# Patient Record
Sex: Female | Born: 1970
Health system: Southern US, Community
[De-identification: ages and names within clinical notes are randomized; demographics above are authoritative.]

## PROBLEM LIST (undated history)

## (undated) DIAGNOSIS — D219 Benign neoplasm of connective and other soft tissue, unspecified: Secondary | ICD-10-CM

## (undated) DIAGNOSIS — R030 Elevated blood-pressure reading, without diagnosis of hypertension: Secondary | ICD-10-CM

## (undated) DIAGNOSIS — Z8619 Personal history of other infectious and parasitic diseases: Secondary | ICD-10-CM

## (undated) DIAGNOSIS — Z87898 Personal history of other specified conditions: Secondary | ICD-10-CM

## (undated) DIAGNOSIS — J45909 Unspecified asthma, uncomplicated: Secondary | ICD-10-CM

## (undated) DIAGNOSIS — T7840XA Allergy, unspecified, initial encounter: Secondary | ICD-10-CM

## (undated) HISTORY — DX: Benign neoplasm of connective and other soft tissue, unspecified: D21.9

## (undated) HISTORY — PX: HERNIA REPAIR: SHX51

## (undated) HISTORY — DX: Elevated blood-pressure reading, without diagnosis of hypertension: R03.0

## (undated) HISTORY — DX: Allergy, unspecified, initial encounter: T78.40XA

## (undated) HISTORY — DX: Personal history of other specified conditions: Z87.898

## (undated) HISTORY — DX: Unspecified asthma, uncomplicated: J45.909

## (undated) HISTORY — DX: Personal history of other infectious and parasitic diseases: Z86.19

## (undated) HISTORY — PX: TONSILLECTOMY AND ADENOIDECTOMY: SUR1326

---

## 2005-03-31 ENCOUNTER — Encounter (INDEPENDENT_AMBULATORY_CARE_PROVIDER_SITE_OTHER): Payer: Self-pay | Admitting: Internal Medicine

## 2005-05-03 ENCOUNTER — Ambulatory Visit: Payer: Self-pay | Admitting: Family Medicine

## 2005-05-08 ENCOUNTER — Ambulatory Visit: Payer: Self-pay | Admitting: Family Medicine

## 2006-01-29 ENCOUNTER — Ambulatory Visit: Payer: Self-pay | Admitting: Family Medicine

## 2007-02-03 ENCOUNTER — Ambulatory Visit: Payer: Self-pay | Admitting: Family Medicine

## 2009-02-10 ENCOUNTER — Ambulatory Visit: Payer: Self-pay | Admitting: Family Medicine

## 2009-02-10 DIAGNOSIS — M542 Cervicalgia: Secondary | ICD-10-CM | POA: Insufficient documentation

## 2009-02-23 ENCOUNTER — Encounter (INDEPENDENT_AMBULATORY_CARE_PROVIDER_SITE_OTHER): Payer: Self-pay | Admitting: Internal Medicine

## 2009-03-04 ENCOUNTER — Encounter (INDEPENDENT_AMBULATORY_CARE_PROVIDER_SITE_OTHER): Payer: Self-pay | Admitting: Internal Medicine

## 2009-03-04 DIAGNOSIS — I10 Essential (primary) hypertension: Secondary | ICD-10-CM | POA: Insufficient documentation

## 2009-03-04 DIAGNOSIS — J45909 Unspecified asthma, uncomplicated: Secondary | ICD-10-CM | POA: Insufficient documentation

## 2009-03-04 DIAGNOSIS — R030 Elevated blood-pressure reading, without diagnosis of hypertension: Secondary | ICD-10-CM

## 2017-01-28 LAB — HM PAP SMEAR: HM Pap smear: NEGATIVE

## 2017-06-12 LAB — HM MAMMOGRAPHY

## 2017-12-11 ENCOUNTER — Encounter: Payer: Self-pay | Admitting: Nurse Practitioner

## 2017-12-11 ENCOUNTER — Ambulatory Visit (INDEPENDENT_AMBULATORY_CARE_PROVIDER_SITE_OTHER): Payer: BLUE CROSS/BLUE SHIELD | Admitting: Nurse Practitioner

## 2017-12-11 ENCOUNTER — Other Ambulatory Visit: Payer: Self-pay

## 2017-12-11 VITALS — BP 155/80 | HR 83 | Temp 97.8°F | Ht 63.5 in | Wt 146.6 lb

## 2017-12-11 DIAGNOSIS — B009 Herpesviral infection, unspecified: Secondary | ICD-10-CM

## 2017-12-11 DIAGNOSIS — B002 Herpesviral gingivostomatitis and pharyngotonsillitis: Secondary | ICD-10-CM

## 2017-12-11 DIAGNOSIS — Z7689 Persons encountering health services in other specified circumstances: Secondary | ICD-10-CM

## 2017-12-11 DIAGNOSIS — D219 Benign neoplasm of connective and other soft tissue, unspecified: Secondary | ICD-10-CM | POA: Diagnosis not present

## 2017-12-11 DIAGNOSIS — Z0000 Encounter for general adult medical examination without abnormal findings: Secondary | ICD-10-CM

## 2017-12-11 DIAGNOSIS — Z789 Other specified health status: Secondary | ICD-10-CM

## 2017-12-11 DIAGNOSIS — R030 Elevated blood-pressure reading, without diagnosis of hypertension: Secondary | ICD-10-CM

## 2017-12-11 MED ORDER — VALACYCLOVIR HCL 1 G PO TABS: 2000.0000 mg | ORAL_TABLET | Freq: Two times a day (BID) | ORAL | 0 refills | Status: AC

## 2017-12-11 NOTE — Patient Instructions (Addendum)
Yilia, Thank you for coming in to clinic today.  1. Dentistry - [facility] Address: [address] [place], [place] [address]  Phone: [phone]  - Ruthy Dick, DDS Address: [address], [place], [place] [address]  Phone: [phone]  2. Optometry/Opthalmology [facility]         Address: [address] [place], [place] [address]    Phone: [phone]       Website: [email or web address] Address: [address], [place], [place] [address]  Phone: [phone]  Website: [email or web address]   [facility]         Address: [place], [place], [place] [address]    Phone: [phone]         [facility]  Address: [address] [place], [place], [place] [address] Phone: [phone]   [facility] Address: [place], [place],  [address]  Phone: [phone]  3. Shingles vaccine: CDC website- Research Shingrix vaccine.  4. Blood Pressure: Goal less than 130/80.  Call for an appointment sooner if you need to come in for blood pressure medicine. - Increase exercise. - Watch diet with lower salt, more vegetables and fruits.    5. You will be due for FASTING BLOOD WORK.  This means you should eat no food or drink after midnight.  Drink only water or coffee without cream/sugar on the morning of your lab visit. - Please go ahead and schedule a "Lab Only" visit in the morning at the clinic for lab draw in the next 7 days. - Your results will be available about 2-3 days after blood draw.  If you have set up a MyChart account, you can can log in to MyChart online to view your results and a brief explanation. Also, we can discuss your results together at your next office visit if you would like.  Please schedule a follow-up appointment with Cassell Smiles, AGNP. Return in about 1 year (around 12/11/2018) for annual physical.  If you have any other questions or concerns, please feel free to call the clinic  or send a message through [place]. You may also schedule an earlier appointment if necessary.  You will receive a survey after today's visit either digitally by e-mail or paper by [employer]. Your experiences and feedback matter to [place].  Please respond so we know how we are doing as we provide care for you.   Cassell Smiles, DNP, AGNP-BC Adult Gerontology Nurse Practitioner [place]

## 2017-12-11 NOTE — Progress Notes (Signed)
Subjective:    Patient ID: Ann Morrow, female    DOB: [birth year], 46 y.o.   MRN: [id number]  Ann Morrow is a 46 y.o. female presenting on 12/11/2017 for Establish Care (physical appt, Recently moved back in the state. )   HPI [facility] Provider Pt last seen by PCP in [place], [place].  Last physical 2017 Dr. Vertell Limber [phone].  Last PAP and Mammograms were ordered by GYN Dr. Alvy Bimler [phone].   Last GYN visit in January 2018.  Uterine Fibroids Has problems w/ multiple uterine fibroids.  Has opted out of surgery and takes OCP instead.  Must use brand name Aleese, Aviane, or similar.  - She has seen GYN and was recommended to have surgical removal of multiple fibroids.  Pt declines currently - Reports no dysmenorrhea when on OCP.   Elevated BP readings w/o hypertension Pt w/ consistently high readings in doctor's offices.  [facility] 130[facility]75.  Family history of hypertension noted.  Pt reports decreased BP when she is walking and exercising more. Is also working to lose 10 lbs of weight.  Oral herpes Pt requests refill of acyclovir for oral herpes outbreaks.  She has completed her last dose about 3 weeks ago and does not have any additional for future outbreaks.  Pt has not previously used valacyclovir, but is willing to try.  Other General History Pt has had trouble w/ vertigo, but no recent symptoms. Pt has also already had shingles outbreak.  No vaccine. Pt is Jehovah's Witness and would like designation placed on chart for refusal of blood products.  Annual Physical Exam Patient has been feeling well.  They have no acute concerns today. Sleeps 7 hours per night interrupted once, but returns to sleep easily.  HEALTH MAINTENANCE: Weight/BMI: healthy Physical activity: regular 2-3 times per week Diet: Has started Keto diet.  Seatbelt: always Sunscreen: regularly w/ prolonged exposure PAP: normal last in  January 2018 Mammogram: normal in 2018 Colon Cancer Screen: wait to 50 HIV: declines Optometry: regular visits Dentistry: regular visits  VACCINES: Tetanus: likely less than 10 years ago, definitely more than 5 years ago Influenza: declines Shingles: declines today. Wants to read more about it.  Past Medical History:  Diagnosis Date  . Allergy   . Asthma   . Elevated BP without diagnosis of hypertension   . Fibroid   . History of shingles   . History of vertigo    Past Surgical History:  Procedure Laterality Date  . HERNIA REPAIR    . TONSILLECTOMY AND ADENOIDECTOMY     Social History   Socioeconomic History  . Marital status: Single    Spouse name: Not on file  . Number of children: Not on file  . Years of education: Not on file  . Highest education level: Not on file  Social Needs  . Financial resource strain: Not hard at all  . Food insecurity - worry: Never true  . Food insecurity - inability: Never true  . Transportation needs - medical: No  . Transportation needs - non-medical: No  Occupational History  . Occupation: Banking  Tobacco Use  . Smoking status: Never Smoker  . Smokeless tobacco: Current User  Substance and Sexual Activity  . Alcohol use: Yes    Alcohol/week: 0.6 oz    Types: 1 Glasses of wine per week  . Drug use: No  . Sexual activity: Yes    Birth control/protection: Pill  Other Topics Concern  . 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  In mutually monogamous relationship and married for 17 years (as of 12/11/17).   Family History  Problem Relation Age of Onset  . Hypertension Mother   . Diabetes Father   . Breast cancer Maternal Grandmother   . Renal cancer Maternal Grandfather   . Liver cancer Paternal Grandmother   . Cancer Paternal Grandfather   . Healthy Brother   . Healthy Brother    Current Outpatient Medications on File Prior to Visit  Medication  Sig  . AVIANE 0.1-20 MG-MCG tablet   . acyclovir (ZOVIRAX) 200 MG capsule Take 200 mg by mouth 5 (five) times daily.   No current facility-administered medications on file prior to visit.     Review of Systems  Constitutional: Negative.   HENT: Negative.   Eyes: Negative.   Respiratory: Negative.   Cardiovascular: Negative.   Gastrointestinal: Positive for constipation.  Endocrine: Negative.   Genitourinary: Positive for pelvic pain (constipation, increased bloating (2/2 uterine fibroids)). Negative for menstrual problem and vaginal pain.  Musculoskeletal: Negative.   Skin: Negative.   Allergic/Immunologic: Negative.   Neurological: Negative.   Hematological: Negative.   Psychiatric/Behavioral: Negative.    Per HPI unless specifically indicated above.     Objective:    BP (!) 155/80 (BP Location: Right Arm, Patient Position: Sitting, Cuff Size: Normal)   Pulse 83   Temp 97.8 F (36.6 C) (Oral)   Ht 5' 3.5" (1.613 m)   Wt 146 lb 9.6 oz (66.5 kg)   BMI 25.56 kg/m   Wt Readings from Last 3 Encounters:  12/11/17 146 lb 9.6 oz (66.5 kg)    Physical Exam General - healthy, well-appearing, NAD HEENT - Normocephalic, atraumatic, PERRL, EOMI, patent nares w/o congestion, oropharynx clear, MMM Neck - supple, non-tender, no LAD, no thyromegaly, no carotid bruit Heart - RRR, no murmurs heard, normal S1/S2  Lungs - Clear throughout all lobes, no wheezing, crackles, or rhonchi. Normal work of breathing. Abdomen - soft, NTND, no masses, no hepatosplenomegaly, active bowel sounds GU and Breast exam deferred by pt today.   Extremeties - non-tender, no edema, cap refill < 2 seconds, peripheral pulses intact +2 bilaterally Skin - warm, dry, no rashes Neuro - awake, alert, oriented x3, CN II-X intact, intact muscle strength 5/5 bilaterally, intact distal sensation to light touch, normal coordination, normal gait Psych - Normal mood and affect, normal behavior   Results for orders  placed or performed in visit on 03/31/05  Converted [facility] Lab  Result Value Ref Range   Pap Smear Done/GYN       Assessment & Plan:   Problem List Items Addressed This Visit      Digestive   Oral herpes simplex, not currently active    Pt requests fill of acyclovir today. Has not used valacyclovir, but is willing to try since has fewer doses required.  Plan: 1. START valacyclovir 2 tablets twice daily for one day at start of oral herpes outbreak. 2. Followup as needed.      Relevant Medications   acyclovir (ZOVIRAX) 200 MG capsule     Other   ELEVATED BP READING WITHOUT DX HYPERTENSION    Pt reports controlled BP readings outside of clinic.  Continue to monitor for elevated BP readings.    Plan: 1. Discussed need for early management of HTN to prevent long-term damage to kidneys, heart, eyes,  blood vessels. 2. Discussed BP goal <130/80 3. Followup at next visit.      Refusal of blood product    Pt is Jehovah's Witness and states, "No whole blood product transfusion." Pt is ok with receiving blood volume expanders.      Fibroid    Pt reports multiple uterine fibroids.  Prefers to continue medical management w/ OCP.  Plan: 1. Encouraged pt to re-establish w/ GYN for ongoing care and future surgical management discussions.  Discussed pt will need to stop taking OCP to allow for completion of menopause in next 5-7 years. 2. Followup as needed.      Relevant Medications   AVIANE 0.1-20 MG-MCG tablet    Other Visit Diagnoses    Annual physical exam    -  Primary Physical exam with no new findings.  Well adult with no acute concerns.  Plan: 1. Obtain health maintenance screenings. 2. Return 1 year for annual physical.   Relevant Orders   Lipid panel   Comprehensive metabolic panel   Hemoglobin A1c   CBC with Differential/Platelet   TSH   Encounter to establish care     Previous PCP was in [place].  Records will be requested.  Past medical, family, and surgical  history reviewed w/ pt.      Meds ordered this encounter  Medications  . valACYclovir (VALTREX) 1000 MG tablet    Sig: Take 2 tablets (2,000 mg total) by mouth 2 (two) times daily for 1 day.    Dispense:  8 tablet    Refill:  0    Order Specific Question:   Supervising Provider    Answer:   Olin Hauser [[id number]]    Follow up plan: Return in about 1 year (around 12/11/2018) for annual physical.  Cassell Smiles, DNP, AGPCNP-BC Adult Gerontology Primary Care Nurse Practitioner [facility] 12/17/2017, 1:22 PM

## 2017-12-17 ENCOUNTER — Encounter: Payer: Self-pay | Admitting: Nurse Practitioner

## 2017-12-17 DIAGNOSIS — B009 Herpesviral infection, unspecified: Secondary | ICD-10-CM | POA: Insufficient documentation

## 2017-12-17 DIAGNOSIS — D219 Benign neoplasm of connective and other soft tissue, unspecified: Secondary | ICD-10-CM | POA: Insufficient documentation

## 2017-12-17 DIAGNOSIS — B002 Herpesviral gingivostomatitis and pharyngotonsillitis: Secondary | ICD-10-CM | POA: Insufficient documentation

## 2017-12-17 NOTE — Assessment & Plan Note (Signed)
Pt reports controlled BP readings outside of clinic.  Continue to monitor for elevated BP readings.    Plan: 1. Discussed need for early management of HTN to prevent long-term damage to kidneys, heart, eyes, blood vessels. 2. Discussed BP goal <130/80 3. Followup at next visit.

## 2017-12-17 NOTE — Assessment & Plan Note (Signed)
Pt reports multiple uterine fibroids.  Prefers to continue medical management w/ OCP.  Plan: 1. Encouraged pt to re-establish w/ GYN for ongoing care and future surgical management discussions.  Discussed pt will need to stop taking OCP to allow for completion of menopause in next 5-7 years. 2. Followup as needed.

## 2017-12-17 NOTE — Assessment & Plan Note (Signed)
Pt is Jehovah's Witness and states, "No whole blood product transfusion." Pt is ok with receiving blood volume expanders.

## 2017-12-17 NOTE — Assessment & Plan Note (Signed)
Pt requests fill of acyclovir today. Has not used valacyclovir, but is willing to try since has fewer doses required.  Plan: 1. START valacyclovir 2 tablets twice daily for one day at start of oral herpes outbreak. 2. Followup as needed.

## 2017-12-18 ENCOUNTER — Other Ambulatory Visit: Payer: BLUE CROSS/BLUE SHIELD

## 2017-12-18 DIAGNOSIS — Z0000 Encounter for general adult medical examination without abnormal findings: Secondary | ICD-10-CM | POA: Diagnosis not present

## 2017-12-19 LAB — CBC WITH DIFFERENTIAL/PLATELET
Basophils Absolute: 48 cells/uL (ref 0–200)
Basophils Relative: 1.5 %
Eosinophils Absolute: 128 cells/uL (ref 15–500)
Eosinophils Relative: 4 %
HCT: 41.5 % (ref 35.0–45.0)
Hemoglobin: 13.7 g/dL (ref 11.7–15.5)
Lymphs Abs: 1264 cells/uL (ref 850–3900)
MCH: 28 pg (ref 27.0–33.0)
MCHC: 33 g/dL (ref 32.0–36.0)
MCV: 84.7 fL (ref 80.0–100.0)
MPV: 11 fL (ref 7.5–12.5)
Monocytes Relative: 10.2 %
Neutro Abs: 1434 cells/uL — ABNORMAL LOW (ref 1500–7800)
Neutrophils Relative %: 44.8 %
Platelets: 326 10*3/uL (ref 140–400)
RBC: 4.9 10*6/uL (ref 3.80–5.10)
RDW: 12.6 % (ref 11.0–15.0)
Total Lymphocyte: 39.5 %
WBC mixed population: 326 cells/uL (ref 200–950)
WBC: 3.2 10*3/uL — ABNORMAL LOW (ref 3.8–10.8)

## 2017-12-19 LAB — COMPREHENSIVE METABOLIC PANEL
AG Ratio: 1.8 (calc) (ref 1.0–2.5)
ALT: 9 U/L (ref 6–29)
AST: 11 U/L (ref 10–35)
Albumin: 4.1 g/dL (ref 3.6–5.1)
Alkaline phosphatase (APISO): 31 U/L — ABNORMAL LOW (ref 33–115)
BUN: 13 mg/dL (ref 7–25)
CO2: 27 mmol/L (ref 20–32)
Calcium: 9.6 mg/dL (ref 8.6–10.2)
Chloride: 103 mmol/L (ref 98–110)
Creat: 1.09 mg/dL (ref 0.50–1.10)
Globulin: 2.3 g/dL (calc) (ref 1.9–3.7)
Glucose, Bld: 99 mg/dL (ref 65–99)
Potassium: 4.2 mmol/L (ref 3.5–5.3)
Sodium: 137 mmol/L (ref 135–146)
Total Bilirubin: 0.5 mg/dL (ref 0.2–1.2)
Total Protein: 6.4 g/dL (ref 6.1–8.1)

## 2017-12-19 LAB — HEMOGLOBIN A1C
Hgb A1c MFr Bld: 5.8 % of total Hgb — ABNORMAL HIGH (ref <5.7)
Mean Plasma Glucose: 120 (calc)
eAG (mmol/L): 6.6 (calc)

## 2017-12-19 LAB — LIPID PANEL
Cholesterol: 170 mg/dL (ref <200)
HDL: 56 mg/dL (ref >50)
LDL Cholesterol (Calc): 96 mg/dL (calc)
Non-HDL Cholesterol (Calc): 114 mg/dL (calc) (ref <130)
Total CHOL/HDL Ratio: 3 (calc) (ref <5.0)
Triglycerides: 86 mg/dL (ref <150)

## 2017-12-19 LAB — TSH: TSH: 0.68 mIU/L

## 2018-01-06 ENCOUNTER — Encounter: Payer: Self-pay | Admitting: Nurse Practitioner

## 2018-01-24 DIAGNOSIS — Z86018 Personal history of other benign neoplasm: Secondary | ICD-10-CM | POA: Diagnosis not present

## 2018-01-24 DIAGNOSIS — D259 Leiomyoma of uterus, unspecified: Secondary | ICD-10-CM | POA: Diagnosis not present

## 2018-01-24 DIAGNOSIS — I10 Essential (primary) hypertension: Secondary | ICD-10-CM | POA: Diagnosis not present

## 2018-01-24 DIAGNOSIS — Z01411 Encounter for gynecological examination (general) (routine) with abnormal findings: Secondary | ICD-10-CM | POA: Diagnosis not present

## 2018-05-01 ENCOUNTER — Encounter: Payer: Self-pay | Admitting: Nurse Practitioner

## 2018-05-01 ENCOUNTER — Other Ambulatory Visit: Payer: Self-pay

## 2018-05-01 ENCOUNTER — Ambulatory Visit (INDEPENDENT_AMBULATORY_CARE_PROVIDER_SITE_OTHER): Payer: BLUE CROSS/BLUE SHIELD | Admitting: Nurse Practitioner

## 2018-05-01 VITALS — BP 132/76 | HR 71 | Temp 98.3°F | Ht 63.5 in | Wt 141.2 lb

## 2018-05-01 DIAGNOSIS — R7303 Prediabetes: Secondary | ICD-10-CM

## 2018-05-01 DIAGNOSIS — K1379 Other lesions of oral mucosa: Secondary | ICD-10-CM

## 2018-05-01 DIAGNOSIS — R198 Other specified symptoms and signs involving the digestive system and abdomen: Secondary | ICD-10-CM

## 2018-05-01 NOTE — Patient Instructions (Addendum)
Ann Morrow,   Thank you for coming in to clinic today.  This is most likely an allergic reaction.  1. Get back to regular toothpaste that you know you tolerate well.  2. Stop using baking soda as this may be worsening.  3. Take Zyrtec daily for about 7 days if you can.  Benadryl may also help if you need additional antihistamine.  Please schedule a follow-up appointment with Cassell Smiles, AGNP. Return 5-7 days if symptoms worsen or fail to improve.  Call your dentist for additional advice.  If you have any other questions or concerns, please feel free to call the clinic or send a message through [place]. You may also schedule an earlier appointment if necessary.  You will receive a survey after today's visit either digitally by e-mail or paper by [employer]. Your experiences and feedback matter to [place].  Please respond so we know how we are doing as we provide care for you.   Cassell Smiles, DNP, AGNP-BC Adult Gerontology Nurse Practitioner [place]

## 2018-05-01 NOTE — Progress Notes (Signed)
Subjective:    Patient ID: Ann Morrow, female    DOB: 1971/09/12, 47 y.o.   MRN: [id number]  Ann Morrow is a 47 y.o. female presenting on 05/01/2018 for Allergic Reaction (swelling in the mouth x 2 weeks )   HPI Mouth Swelling Lateral lips/buccal mucosa are swollen and tender x 2 weeks.  Today is improved.  No new products or foods to patient's knowledge.  However, after further thought pt admits she did try new toothpaste that caused irritation.  Is now using only baking soda for last 1.5 weeks.   - Has taken Zyrtec about every other day over last 2 weeks.There have been some sores in mouth with erythema closer to vermillion border of lips. - She has gargled with salt water, oregano oil. - She has also used a small amount of peppermint oil with her baking soda for brushing her teeth.  Prediabetes At annual physical screening, pt's A1c was 5.8%.  Since that visit, pt has reduced sugar intake and increased exercise.   - Current treatment : lifestyle - She is not currently symptomatic.  She also states she had no symptoms  - She denies polydipsia, polyphagia, polyuria, headaches, diaphoresis, shakiness, chills, pain, numbness or tingling in extremities and changes in vision.    Recent Labs    12/18/17 0803  HGBA1C 5.8*    Social History   Tobacco Use  . Smoking status: Never Smoker  . Smokeless tobacco: Current User  Substance Use Topics  . Alcohol use: Yes    Alcohol/week: 0.6 oz    Types: 1 Glasses of wine per week  . Drug use: No    Review of Systems Per HPI unless specifically indicated above     Objective:    BP 132/76 (BP Location: Right Arm, Patient Position: Sitting, Cuff Size: Normal)   Pulse 71   Temp 98.3 F (36.8 C) (Oral)   Ht 5' 3.5" (1.613 m)   Wt 141 lb 3.2 oz (64 kg)   LMP 04/01/2018 (Approximate)   BMI 24.62 kg/m   Wt Readings from Last 3 Encounters:  05/01/18 141 lb 3.2 oz (64 kg)  12/11/17 146 lb 9.6 oz (66.5 kg)    Physical Exam    Constitutional: She is oriented to person, place, and time. She appears well-developed and well-nourished. No distress.  HENT:  Head: Normocephalic and atraumatic.  Mouth/Throat: Uvula is midline and oropharynx is clear and moist.    Small line of erythematous pustules at vermillion border of oral mucosa.  Additionally, small lesion < 3 mm x 1 mm erythematous patch with raised borders is present.  Neck: Normal range of motion.  Pulmonary/Chest: Effort normal.  Neurological: She is alert and oriented to person, place, and time.  Skin: Skin is warm and dry. Capillary refill takes less than 2 seconds.  Psychiatric: She has a normal mood and affect. Her behavior is normal. Judgment and thought content normal.  Vitals reviewed.    Results for orders placed or performed in visit on 01/06/18  HM MAMMOGRAPHY  Result Value Ref Range   HM Mammogram 0-4 Bi-Rad 0-4 Bi-Rad, Self Reported Normal  HM PAP SMEAR  Result Value Ref Range   HM Pap smear negative       Assessment & Plan:   Problem List Items Addressed This Visit    None    Visit Diagnoses    Mucosal irritation of oral cavity    -  Primary Likely irritant or contact allergy 2/2  new toothpaste exacerbated by baking soda use.  Possible irritation also caused by peppermint oil.    Plan: 1. May continue salt water gargles. 2. Avoid use of baking soda as it may be irritating. 3. Return to previously trusted brand of toothpaste. 4. Take Zyrtec or Benadryl daily for at least 7 days. 5. Considered prednisone, but pt declines.  Severity of lesions also does not require treatment with prednisone at this time. 6. Followup with dentist for further evaluation of lesions if no improvement in next 5-7 days.    Prediabetes     Pt with new reading of A1c = 5.8% indicated prediabetes at annual physical in December 2019.  Pt has made lifestyle changes and would like to repeat her lab to verify the impact it is having.  Plan:  1. Continue  lifestyle modification 2. Encourage improved lifestyle: - low carb/low glycemic diet reinforced prior education - Increase physical activity to 30 minutes most days of the week.   6. Follow-up with annual physical screenings in December.    Relevant Orders   Hemoglobin A1c      Follow up plan: Return 5-7 days if symptoms worsen or fail to improve.  Cassell Smiles, DNP, AGPCNP-BC Adult Gerontology Primary Care Nurse Practitioner [facility] 05/01/2018, 9:03 AM

## 2018-05-02 LAB — HEMOGLOBIN A1C
Hgb A1c MFr Bld: 5.8 % of total Hgb — ABNORMAL HIGH (ref <5.7)
Mean Plasma Glucose: 120 (calc)
eAG (mmol/L): 6.6 (calc)

## 2018-08-15 ENCOUNTER — Other Ambulatory Visit: Payer: Self-pay | Admitting: Obstetrics and Gynecology

## 2018-08-15 DIAGNOSIS — Z1231 Encounter for screening mammogram for malignant neoplasm of breast: Secondary | ICD-10-CM

## 2018-09-03 ENCOUNTER — Ambulatory Visit
Admission: RE | Admit: 2018-09-03 | Discharge: 2018-09-03 | Disposition: A | Discharge status: Home / Self Care | Payer: BLUE CROSS/BLUE SHIELD | Source: Ambulatory Visit | Attending: Obstetrics and Gynecology | Admitting: Obstetrics and Gynecology

## 2018-09-03 DIAGNOSIS — Z1231 Encounter for screening mammogram for malignant neoplasm of breast: Secondary | ICD-10-CM | POA: Diagnosis not present

## 2018-09-10 ENCOUNTER — Other Ambulatory Visit: Payer: Self-pay | Admitting: *Deleted

## 2018-09-10 ENCOUNTER — Inpatient Hospital Stay
Admission: RE | Admit: 2018-09-10 | Discharge: 2018-09-10 | Disposition: A | Discharge status: Home / Self Care | Payer: Self-pay | Source: Ambulatory Visit | Attending: *Deleted | Admitting: *Deleted

## 2018-09-10 DIAGNOSIS — Z9289 Personal history of other medical treatment: Secondary | ICD-10-CM

## 2018-09-19 ENCOUNTER — Telehealth: Payer: Self-pay | Admitting: Nurse Practitioner

## 2018-09-19 ENCOUNTER — Other Ambulatory Visit: Payer: Self-pay

## 2018-09-19 DIAGNOSIS — D219 Benign neoplasm of connective and other soft tissue, unspecified: Secondary | ICD-10-CM

## 2018-09-19 MED ORDER — AVIANE 0.1-20 MG-MCG PO TABS: 1.0000 | ORAL_TABLET | Freq: Every day | ORAL | 3 refills | Status: DC

## 2018-09-19 NOTE — Telephone Encounter (Signed)
Pt asked if she could get refill on aviane.  [facility] [place]  Her call back number is [phone]

## 2018-09-19 NOTE — Telephone Encounter (Signed)
Prescription sent and I attempted to contact the patient to notify.

## 2018-12-10 ENCOUNTER — Other Ambulatory Visit: Payer: Self-pay | Admitting: Nurse Practitioner

## 2018-12-10 DIAGNOSIS — Z0000 Encounter for general adult medical examination without abnormal findings: Secondary | ICD-10-CM

## 2018-12-10 DIAGNOSIS — Z114 Encounter for screening for human immunodeficiency virus [HIV]: Secondary | ICD-10-CM

## 2018-12-16 ENCOUNTER — Ambulatory Visit (INDEPENDENT_AMBULATORY_CARE_PROVIDER_SITE_OTHER): Payer: BLUE CROSS/BLUE SHIELD | Admitting: Nurse Practitioner

## 2018-12-16 ENCOUNTER — Encounter: Payer: BLUE CROSS/BLUE SHIELD | Admitting: Nurse Practitioner

## 2018-12-16 ENCOUNTER — Encounter: Payer: Self-pay | Admitting: Nurse Practitioner

## 2018-12-16 ENCOUNTER — Other Ambulatory Visit: Payer: Self-pay

## 2018-12-16 VITALS — BP 133/73 | HR 77 | Temp 98.3°F | Ht 63.5 in | Wt 144.8 lb

## 2018-12-16 DIAGNOSIS — Z0000 Encounter for general adult medical examination without abnormal findings: Secondary | ICD-10-CM | POA: Diagnosis not present

## 2018-12-16 DIAGNOSIS — N926 Irregular menstruation, unspecified: Secondary | ICD-10-CM

## 2018-12-16 DIAGNOSIS — Z114 Encounter for screening for human immunodeficiency virus [HIV]: Secondary | ICD-10-CM | POA: Diagnosis not present

## 2018-12-16 DIAGNOSIS — R002 Palpitations: Secondary | ICD-10-CM | POA: Diagnosis not present

## 2018-12-16 NOTE — Progress Notes (Signed)
Subjective:    Patient ID: Ann Morrow Morrow, female    DOB: [birth year], 47 y.o.   MRN: [id number]  Ann Morrow Morrow is a 47 y.o. female presenting on 12/16/2018 for Annual Exam (pt noticing intemittent fluttering while lying down x 1 mth. )   HPI Annual Physical Exam Patient has been feeling generally well.  They have acute concerns today for palpitations. Sleeps 7-8 hours per night interrupted usually to use the bathroom from fibroids.  Is easily able to return to sleep. She is interested in knowing if she is in perimenopause - is hopeful that her fibroids may shrink with less estrogen.  HEALTH MAINTENANCE: Weight/BMI: stable Physical activity: regular activity, some walking with good weather/aerobics at home 20-30 mins 3 days per week. Diet: regular, plenty of fruits and vegetables.  Had done keto for a little while.  Seatbelt: regular Sunscreen: more regularly in summer PAP: receives at GYN Mammogram: 08/2018 DEXA: not due - every other cycle is longer Colon Cancer Screen: not due Optometry: regular Dentistry: regular  VACCINES: Tetanus: due - defer Influenza: due - declines Shingrix: - patient has had shingles in past - would like to try to see if shingrix would be covered since she has had shingles already.   Palpitations New palpitations over last 1 month when she lies down at night.  Not racing, but occasionally feels uncomfortable.  Does not occur all the time, but may be related to coffee/caffeine.  Past Medical History:  Diagnosis Date  . Allergy   . Asthma   . Elevated BP without diagnosis of hypertension   . Fibroid   . History of shingles   . History of vertigo    Past Surgical History:  Procedure Laterality Date  . HERNIA REPAIR    . TONSILLECTOMY AND ADENOIDECTOMY     Social History   Socioeconomic History  . Marital status: Single    Spouse name: Not on file  . Number of children: Not on file  . Years of education: Not on file  . Highest education  level: Not on file  Occupational History  . Occupation: Science writer  Social Needs  . Financial resource strain: Not hard at all  . Food insecurity:    Worry: Never true    Inability: Never true  . Transportation needs:    Medical: No    Non-medical: No  Tobacco Use  . Smoking status: Never Smoker  . Smokeless tobacco: Current User  Substance and Sexual Activity  . Alcohol use: Yes    Alcohol/week: 1.0 standard drinks    Types: 1 Glasses of wine per week  . Drug use: No  . Sexual activity: Yes    Birth control/protection: Pill  Lifestyle  . Physical activity:    Days per week: 2 days    Minutes per session: 50 min  . Stress: Not at all  Relationships  . Social connections:    Talks on phone: More than three times a week    Gets together: More than three times a week    Attends religious service: More than 4 times per year    Active member of club or organization: Yes    Attends meetings of clubs or organizations: More than 4 times per year    Relationship status: Married  . Intimate partner violence:    Fear of current or ex partner: No    Emotionally abused: No    Physically abused: No    Forced sexual activity: No  Other Topics Concern  . 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  In mutually monogamous relationship and married for 17 years (as of 12/11/17).   Family History  Problem Relation Age of Onset  . Hypertension Mother   . Glaucoma Mother   . Diabetes Father   . Breast cancer Maternal Grandmother 14  . Renal cancer Maternal Grandfather   . Liver cancer Paternal Grandmother   . Cancer Paternal Grandfather   . Healthy Brother   . Healthy Brother   . Breast cancer Cousin 44       paternal side   Current Outpatient Medications on File Prior to Visit  Medication Sig  . acyclovir (ZOVIRAX) 200 MG capsule Take 200 mg by mouth 5 (five) times daily.  Vassie Moselle 0.1-20 MG-MCG tablet Take 1  tablet by mouth daily.   No current facility-administered medications on file prior to visit.     Review of Systems  Constitutional: Negative for chills and fever.  HENT: Negative for congestion and sore throat.   Eyes: Negative for pain.  Respiratory: Negative for cough, shortness of breath and wheezing.   Cardiovascular: Positive for palpitations. Negative for chest pain and leg swelling.  Gastrointestinal: Negative for abdominal pain, blood in stool, constipation, diarrhea, nausea and vomiting.  Endocrine: Negative for polydipsia.  Genitourinary: Negative for dysuria, frequency, hematuria and urgency.  Musculoskeletal: Negative for back pain, myalgias and neck pain.  Skin: Negative.  Negative for rash.  Allergic/Immunologic: Negative for environmental allergies.  Neurological: Negative for dizziness, weakness and headaches.  Hematological: Does not bruise/bleed easily.  Psychiatric/Behavioral: Negative for dysphoric mood and suicidal ideas. The patient is not nervous/anxious.    Per HPI unless specifically indicated above     Objective:    BP 133/73 (BP Location: Left Arm, Patient Position: Sitting, Cuff Size: Normal)   Pulse 77   Temp 98.3 F (36.8 C) (Oral)   Ht 5' 3.5" (1.613 m)   Wt 144 lb 12.8 oz (65.7 kg)   BMI 25.25 kg/m   Wt Readings from Last 3 Encounters:  12/16/18 144 lb 12.8 oz (65.7 kg)  05/01/18 141 lb 3.2 oz (64 kg)  12/11/17 146 lb 9.6 oz (66.5 kg)    Physical Exam Vitals signs and nursing note reviewed.  Constitutional:      General: She is not in acute distress.    Appearance: She is well-developed.  HENT:     Head: Normocephalic and atraumatic.     Right Ear: External ear normal.     Left Ear: External ear normal.     Nose: Nose normal.  Eyes:     Conjunctiva/sclera: Conjunctivae normal.     Pupils: Pupils are equal, round, and reactive to light.  Neck:     Musculoskeletal: Normal range of motion and neck supple.     Thyroid: No thyromegaly.      Vascular: No JVD.     Trachea: No tracheal deviation.  Cardiovascular:     Rate and Rhythm: Normal rate and regular rhythm.     Heart sounds: Normal heart sounds. No murmur. No friction rub. No gallop.   Pulmonary:     Effort: Pulmonary effort is normal. No respiratory distress.     Breath sounds: Normal breath sounds.  Chest:     Comments: Breast - Normal exam w/ symmetric breasts, no mass, no nipple discharge, no skin changes or tenderness. Abdominal:     General:  Bowel sounds are normal. There is no distension.     Palpations: Abdomen is soft.     Tenderness: There is no abdominal tenderness.  Musculoskeletal: Normal range of motion.  Lymphadenopathy:     Cervical: No cervical adenopathy.  Skin:    General: Skin is warm and dry.     Capillary Refill: Capillary refill takes less than 2 seconds.       Neurological:     General: No focal deficit present.     Mental Status: She is alert and oriented to person, place, and time. Mental status is at baseline.     Cranial Nerves: No cranial nerve deficit.  Psychiatric:        Mood and Affect: Mood normal.        Behavior: Behavior normal.        Thought Content: Thought content normal.        Judgment: Judgment normal.    Results for orders placed or performed in visit on 05/01/18  Hemoglobin A1c  Result Value Ref Range   Hgb A1c MFr Bld 5.8 (H) <5.7 % of total Hgb   Mean Plasma Glucose 120 (calc)   eAG (mmol/L) 6.6 (calc)      Assessment & Plan:   Problem List Items Addressed This Visit    None    Visit Diagnoses    Annual physical exam    -  Primary Physical exam with no new findings.  Well adult with acute concerns for palpitations (intermittent) and irregular menses.  Plan: 1. Obtain health maintenance screenings as above according to age. - Increase physical activity to 30 minutes most days of the week.  - Eat healthy diet high in vegetables and fruits; low in refined carbohydrates. - Labs today as ordered  prior to visit. - Mammo complete in September and normal.  Repeat 2 years. - Recommended patient consider Shingrix since she has had shingles already.  Patient to call insurance for cost. 2. Return 1 year for annual physical.     Screening for HIV without presence of risk factors       Irregular menses     Patient interested in perimenopausal vs premenopausal status. Check LH/FSH today. Additional follow-up prn with GYN.   Relevant Orders   FSH/LH   Heart palpitations     Intermittent and stable.  Patient without symptoms daily.  No racing HR and not currently experiencing any symptoms.  Plan: 1. Continue monitoring frequency and duration - isolated, coupled, sustained.  Monitor also for possible triggers. 2. Can perform EKG, but not likely to capture as pulse is normal today - patient and provider deferred after discussion.  Can consider future 24-48 hr holter monitor prn.  Patient will call if desires this. 3. Follow-up prn.      Follow up plan: Return in about 1 year (around 12/17/2019) for annual physical.  Cassell Smiles, DNP, AGPCNP-BC Adult Gerontology Primary Care Nurse Practitioner [facility] 12/16/2018, 10:37 AM

## 2018-12-16 NOTE — Patient Instructions (Addendum)
Ann Morrow,   Thank you for coming in to clinic today.  1. Keep living your active and healthy lifestyle.  Continue focusing on eating more fruits and vegetables, fewer sweets.  2. Shingrix - Shingles vaccine: 2 doses 2-6 months apart.  http://www.wolf.info/ for more information if you have questions.  3. Continue GYN follow-up for your regular PAP and fibroid followup.  4. Please monitor your flutters.  See if they are happening almost daily and if there is anything that leads to them (caffeine, stress, anxiousness).  If you have any heart racing, please let me know.  Please schedule a follow-up appointment with Cassell Smiles, AGNP. Return in about 1 year (around 12/17/2019) for annual physical.  If you have any other questions or concerns, please feel free to call the clinic or send a message through [employer]. You may also schedule an earlier appointment if necessary.  You will receive a survey after today's visit either digitally by e-mail or paper by [employer]. Your experiences and feedback matter to [place].  Please respond so we know how we are doing as we provide care for you.   Cassell Smiles, DNP, AGNP-BC Adult Gerontology Nurse Practitioner [facility]

## 2018-12-18 LAB — CBC WITH DIFFERENTIAL/PLATELET
Absolute Monocytes: 308 cells/uL (ref 200–950)
Basophils Absolute: 68 cells/uL (ref 0–200)
Basophils Relative: 1.8 %
Eosinophils Absolute: 118 cells/uL (ref 15–500)
Eosinophils Relative: 3.1 %
HCT: 42.9 % (ref 35.0–45.0)
Hemoglobin: 13.9 g/dL (ref 11.7–15.5)
Lymphs Abs: 1634 cells/uL (ref 850–3900)
MCH: 28.4 pg (ref 27.0–33.0)
MCHC: 32.4 g/dL (ref 32.0–36.0)
MCV: 87.6 fL (ref 80.0–100.0)
MPV: 11.1 fL (ref 7.5–12.5)
Monocytes Relative: 8.1 %
Neutro Abs: 1672 cells/uL (ref 1500–7800)
Neutrophils Relative %: 44 %
Platelets: 315 10*3/uL (ref 140–400)
RBC: 4.9 10*6/uL (ref 3.80–5.10)
RDW: 12.4 % (ref 11.0–15.0)
Total Lymphocyte: 43 %
WBC: 3.8 10*3/uL (ref 3.8–10.8)

## 2018-12-18 LAB — COMPLETE METABOLIC PANEL WITH GFR
AG Ratio: 1.7 (calc) (ref 1.0–2.5)
ALT: 11 U/L (ref 6–29)
AST: 11 U/L (ref 10–35)
Albumin: 4.2 g/dL (ref 3.6–5.1)
Alkaline phosphatase (APISO): 25 U/L — ABNORMAL LOW (ref 33–115)
BUN/Creatinine Ratio: 12 (calc) (ref 6–22)
BUN: 13 mg/dL (ref 7–25)
CO2: 26 mmol/L (ref 20–32)
Calcium: 9.7 mg/dL (ref 8.6–10.2)
Chloride: 105 mmol/L (ref 98–110)
Creat: 1.13 mg/dL — ABNORMAL HIGH (ref 0.50–1.10)
GFR, Est African American: 67 mL/min/{1.73_m2} (ref >=60)
GFR, Est Non African American: 57 mL/min/{1.73_m2} — ABNORMAL LOW (ref >=60)
Globulin: 2.5 g/dL (calc) (ref 1.9–3.7)
Glucose, Bld: 92 mg/dL (ref 65–99)
Potassium: 4.7 mmol/L (ref 3.5–5.3)
Sodium: 139 mmol/L (ref 135–146)
Total Bilirubin: 0.6 mg/dL (ref 0.2–1.2)
Total Protein: 6.7 g/dL (ref 6.1–8.1)

## 2018-12-18 LAB — LIPID PANEL
Cholesterol: 186 mg/dL (ref <200)
HDL: 68 mg/dL (ref >50)
LDL Cholesterol (Calc): 104 mg/dL (calc) — ABNORMAL HIGH
Non-HDL Cholesterol (Calc): 118 mg/dL (calc) (ref <130)
Total CHOL/HDL Ratio: 2.7 (calc) (ref <5.0)
Triglycerides: 59 mg/dL (ref <150)

## 2018-12-18 LAB — FSH/LH
FSH: 11.5 m[IU]/mL
LH: 4.9 m[IU]/mL

## 2018-12-18 LAB — HEMOGLOBIN A1C
Hgb A1c MFr Bld: 5.6 % of total Hgb (ref <5.7)
Mean Plasma Glucose: 114 (calc)
eAG (mmol/L): 6.3 (calc)

## 2018-12-18 LAB — HIV ANTIBODY (ROUTINE TESTING W REFLEX): HIV 1&2 Ab, 4th Generation: NONREACTIVE

## 2018-12-18 LAB — TSH: TSH: 1.12 mIU/L

## 2019-01-08 DIAGNOSIS — R310 Gross hematuria: Secondary | ICD-10-CM | POA: Diagnosis not present

## 2019-01-08 DIAGNOSIS — R030 Elevated blood-pressure reading, without diagnosis of hypertension: Secondary | ICD-10-CM | POA: Diagnosis not present

## 2019-01-09 ENCOUNTER — Other Ambulatory Visit: Payer: Self-pay | Admitting: Nurse Practitioner

## 2019-01-09 DIAGNOSIS — D219 Benign neoplasm of connective and other soft tissue, unspecified: Secondary | ICD-10-CM

## 2019-01-09 DIAGNOSIS — R102 Pelvic and perineal pain: Secondary | ICD-10-CM | POA: Diagnosis not present

## 2019-01-09 DIAGNOSIS — Z86018 Personal history of other benign neoplasm: Secondary | ICD-10-CM | POA: Diagnosis not present

## 2019-03-03 ENCOUNTER — Encounter: Payer: Self-pay | Admitting: Nurse Practitioner

## 2019-03-03 ENCOUNTER — Ambulatory Visit (INDEPENDENT_AMBULATORY_CARE_PROVIDER_SITE_OTHER): Payer: BLUE CROSS/BLUE SHIELD | Admitting: Nurse Practitioner

## 2019-03-03 ENCOUNTER — Other Ambulatory Visit: Payer: Self-pay

## 2019-03-03 VITALS — BP 150/90 | HR 87 | Temp 98.0°F | Ht 63.5 in | Wt 145.4 lb

## 2019-03-03 DIAGNOSIS — I10 Essential (primary) hypertension: Secondary | ICD-10-CM | POA: Diagnosis not present

## 2019-03-03 DIAGNOSIS — H1131 Conjunctival hemorrhage, right eye: Secondary | ICD-10-CM

## 2019-03-03 MED ORDER — HYDROCHLOROTHIAZIDE 12.5 MG PO TABS: 12.5000 mg | ORAL_TABLET | Freq: Every day | ORAL | 5 refills | Status: DC

## 2019-03-03 NOTE — Progress Notes (Signed)
Subjective:    Patient ID: Ann Morrow, female    DOB: 07/01/1971, 48 y.o.   MRN: [id number]  Ann Morrow is a 48 y.o. female presenting on 03/03/2019 for Eye Problem (Recurrent subconjunctival hemorrhage happening  at least once a month for the last 6 mths)   HPI Recurrent subconjunctival hemorrhage Patient has had burst vessels 1-2 times per month.  Has been occurring for last 5-6 months.  Occurs almost always on RIGHT eye. Most recently, patient was riding in car Sunday when her husband noticed this after they got to their destination.  Prior, patient was at work sat down in office meeting with sudden onset about 1 month ago when a coworker noted this.  After initial hemorrhages, patient started monitoring for triggers.  She has had no sneezing/coughing, heavy lifting, no other known precipitating factors.   - Patient went to see her optometrist yesterday and has good report without any treatable complications.  Optometrist recommended she see PCP for labs to ID possible cause. - Brother had blood clot, but not likely bleeding disorder - stayed on blood thinner.  [place]  Hypertension - Patient notes BP at home in 130s/80s.  Patient has been practicing lifestyle management of symptoms to date, but notes her BP is occasionally elevated into 150-160s as it is today.  She states immediately that she will not take medications for it.  Instead, chooses to focus on lack of walking over last several months and more poor diet.  Social History   Tobacco Use  . Smoking status: Never Smoker  . Smokeless tobacco: Current User  Substance Use Topics  . Alcohol use: Yes    Alcohol/week: 1.0 standard drinks    Types: 1 Glasses of wine per week  . Drug use: No    Review of Systems Per HPI unless specifically indicated above     Objective:    BP (!) 154/72 (BP Location: Left Arm, Patient Position: Sitting, Cuff Size: Normal)   Pulse 87   Temp 98 F (36.7 C) (Oral)    Ht 5' 3.5" (1.613 m)   Wt 145 lb 6.4 oz (66 kg)   BMI 25.35 kg/m   Wt Readings from Last 3 Encounters:  03/03/19 145 lb 6.4 oz (66 kg)  12/16/18 144 lb 12.8 oz (65.7 kg)  05/01/18 141 lb 3.2 oz (64 kg)    Physical Exam Vitals signs reviewed.  Constitutional:      General: She is not in acute distress.    Appearance: She is well-developed.  HENT:     Head: Normocephalic and atraumatic.     Mouth/Throat:     Mouth: Mucous membranes are moist.     Pharynx: Oropharynx is clear.  Eyes:     General: Lids are normal.     Extraocular Movements: Extraocular movements intact.     Conjunctiva/sclera:     Right eye: Hemorrhage (moderate with some clearing of erythematous areas) present. No chemosis or exudate.    Left eye: Left conjunctiva is not injected. No chemosis, exudate or hemorrhage. Cardiovascular:     Rate and Rhythm: Normal rate and regular rhythm.     Pulses:          Radial pulses are 2+ on the right side and 2+ on the left side.       Posterior tibial pulses are 1+ on the right side and 1+ on the left side.     Heart sounds: Normal heart sounds, S1  normal and S2 normal.  Pulmonary:     Effort: Pulmonary effort is normal. No respiratory distress.     Breath sounds: Normal breath sounds and air entry.  Musculoskeletal:     Right lower leg: No edema.     Left lower leg: No edema.  Skin:    General: Skin is warm and dry.     Capillary Refill: Capillary refill takes less than 2 seconds.  Neurological:     General: No focal deficit present.     Mental Status: She is alert and oriented to person, place, and time.     Gait: Gait normal.  Psychiatric:        Attention and Perception: Attention normal.        Mood and Affect: Mood and affect normal.        Behavior: Behavior normal. Behavior is cooperative.       Results for orders placed or performed in visit on 03/03/19  CBC with Differential/Platelet  Result Value Ref Range   WBC 4.3 3.4 - 10.8 x10E3/uL   RBC 4.83  3.77 - 5.28 x10E6/uL   Hemoglobin 13.8 11.1 - 15.9 g/dL   Hematocrit 42.2 34.0 - 46.6 %   MCV 87 79 - 97 fL   MCH 28.6 26.6 - 33.0 pg   MCHC 32.7 31.5 - 35.7 g/dL   RDW 12.8 11.7 - 15.4 %   Platelets 339 150 - 450 x10E3/uL   Neutrophils 51 Not Estab. %   Lymphs 35 Not Estab. %   Monocytes 9 Not Estab. %   Eos 3 Not Estab. %   Basos 2 Not Estab. %   Neutrophils Absolute 2.2 1.4 - 7.0 x10E3/uL   Lymphocytes Absolute 1.5 0.7 - 3.1 x10E3/uL   Monocytes Absolute 0.4 0.1 - 0.9 x10E3/uL   EOS (ABSOLUTE) 0.1 0.0 - 0.4 x10E3/uL   Basophils Absolute 0.1 0.0 - 0.2 x10E3/uL   Immature Granulocytes 0 Not Estab. %   Immature Grans (Abs) 0.0 0.0 - 0.1 x10E3/uL  PT and PTT  Result Value Ref Range   INR 1.0 0.8 - 1.2   Prothrombin Time 10.2 9.1 - 12.0 sec   aPTT 29 24 - 33 sec      Assessment & Plan:   Problem List Items Addressed This Visit    None    Visit Diagnoses    Essential hypertension    -  Primary   Relevant Medications   hydrochlorothiazide (HYDRODIURIL) 12.5 MG tablet   Scleral hemorrhage of right eye       Relevant Orders   CBC with Differential/Platelet (Completed)   PT and PTT (Completed)      Uncontrolled hypertension - New diagnosis from previously elevated BP reading without diagnosis of hypertension.  BP goal < 130/80.  Pt is not currently working on lifestyle modifications and has not been on medications before.  Complication: subconjunctival/scleral hemorrhage RIGHT eye, recurrent - Patient seems to be in denial about hypertension diagnosis   Plan: 1. Disease education provided including immediate risks and long-term risks of continuing to have uncontrolled BP. - START taking HCTZ 12.5 mg once daily.  Patient states she will not take this, but will take the prescription and start it if her BP doesn't return to mid 130s at home.  Again, emphasized that SBP should be less than 130 for best control.  2. Obtain labs today - CMP, PT/INR, PTT  3. Encouraged heart  healthy diet and increasing exercise to 30 minutes most days of  the week.  Provided DASH eating plan handout. 4. Check BP 1-2 x per week at home, keep log, and bring to clinic at next appointment. 5. Follow up 3 months.    Meds ordered this encounter  Medications  . hydrochlorothiazide (HYDRODIURIL) 12.5 MG tablet    Sig: Take 1 tablet (12.5 mg total) by mouth daily.    Dispense:  30 tablet    Refill:  5    Order Specific Question:   Supervising Provider    Answer:   Olin Hauser [[id number]]    Follow up plan: Return in about 3 months (around 06/03/2019) for hypertension.  Cassell Smiles, DNP, AGPCNP-BC Adult Gerontology Primary Care Nurse Practitioner [facility] 03/03/2019, 3:17 PM

## 2019-03-03 NOTE — Patient Instructions (Addendum)
Ann Morrow,   Thank you for coming in to clinic today.  1. Labs for blood counts and clotting times are ordered.  2. Work on stress management to lower blood pressure.  This is a possible cause of your hematomas in eyes. - in 1-2 weeks or sooner BP higher, start hydyrochlorothiazide 12.5 mg once daily. - Long-term BP goal < 130/80.  For now, goal is < 140/90   Please schedule a follow-up appointment with Cassell Smiles, AGNP. Return in about 3 months (around 06/03/2019) for hypertension.  If you have any other questions or concerns, please feel free to call the clinic or send a message through [place]. You may also schedule an earlier appointment if necessary.  You will receive a survey after today's visit either digitally by e-mail or paper by [employer]. Your experiences and feedback matter to [place].  Please respond so we know how we are doing as we provide care for you.   Cassell Smiles, DNP, AGNP-BC Adult Gerontology Nurse Practitioner [facility], [facility]    Managing Your Hypertension Hypertension is commonly called high blood pressure. This is when the force of your blood pressing against the walls of your arteries is too strong. Arteries are blood vessels that carry blood from your heart throughout your body. Hypertension forces the heart to work harder to pump blood, and may cause the arteries to become narrow or stiff. Having untreated or uncontrolled hypertension can cause heart attack, stroke, kidney disease, and other problems. What are blood pressure readings? A blood pressure reading consists of a higher number over a lower number. Ideally, your blood pressure should be below 120/80. The first ("top") number is called the systolic pressure. It is a measure of the pressure in your arteries as your heart beats. The second ("bottom") number is called the diastolic pressure. It is a measure of the pressure in your arteries as the heart relaxes. What does my blood  pressure reading mean? Blood pressure is classified into four stages. Based on your blood pressure reading, your health care provider may use the following stages to determine what type of treatment you need, if any. Systolic pressure and diastolic pressure are measured in a unit called mm Hg. Normal  Systolic pressure: below 259.  Diastolic pressure: below 80. Elevated  Systolic pressure: 563-875.  Diastolic pressure: below 80. Hypertension stage 1  Systolic pressure: 643-329.  Diastolic pressure: 51-88. Hypertension stage 2  Systolic pressure: 416 or above.  Diastolic pressure: 90 or above. What health risks are associated with hypertension? Managing your hypertension is an important responsibility. Uncontrolled hypertension can lead to:  A heart attack.  A stroke.  A weakened blood vessel (aneurysm).  Heart failure.  Kidney damage.  Eye damage.  Metabolic syndrome.  Memory and concentration problems. What changes can I make to manage my hypertension? Hypertension can be managed by making lifestyle changes and possibly by taking medicines. Your health care provider will help you make a plan to bring your blood pressure within a normal range. Eating and drinking   Eat a diet that is high in fiber and potassium, and low in salt (sodium), added sugar, and fat. An example eating plan is called the DASH (Dietary Approaches to Stop Hypertension) diet. To eat this way: ? Eat plenty of fresh fruits and vegetables. Try to fill half of your plate at each meal with fruits and vegetables. ? Eat whole grains, such as whole wheat pasta, brown rice, or whole grain bread. Fill  about one quarter of your plate with whole grains. ? Eat low-fat diary products. ? Avoid fatty cuts of meat, processed or cured meats, and poultry with skin. Fill about one quarter of your plate with lean proteins such as fish, chicken without skin, beans, eggs, and tofu. ? Avoid premade and processed foods.  These tend to be higher in sodium, added sugar, and fat.  Reduce your daily sodium intake. Most people with hypertension should eat less than 1,500 mg of sodium a day.  Limit alcohol intake to no more than 1 drink a day for nonpregnant women and 2 drinks a day for men. One drink equals 12 oz of beer, 5 oz of wine, or 1 oz of hard liquor. Lifestyle  Work with your health care provider to maintain a healthy body weight, or to lose weight. Ask what an ideal weight is for you.  Get at least 30 minutes of exercise that causes your heart to beat faster (aerobic exercise) most days of the week. Activities may include walking, swimming, or biking.  Include exercise to strengthen your muscles (resistance exercise), such as weight lifting, as part of your weekly exercise routine. Try to do these types of exercises for 30 minutes at least 3 days a week.  Do not use any products that contain nicotine or tobacco, such as cigarettes and e-cigarettes. If you need help quitting, ask your health care provider.  Control any long-term (chronic) conditions you have, such as high cholesterol or diabetes. Monitoring  Monitor your blood pressure at home as told by your health care provider. Your personal target blood pressure may vary depending on your medical conditions, your age, and other factors.  Have your blood pressure checked regularly, as often as told by your health care provider. Working with your health care provider  Review all the medicines you take with your health care provider because there may be side effects or interactions.  Talk with your health care provider about your diet, exercise habits, and other lifestyle factors that may be contributing to hypertension.  Visit your health care provider regularly. Your health care provider can help you create and adjust your plan for managing hypertension. Will I need medicine to control my blood pressure? Your health care provider may prescribe  medicine if lifestyle changes are not enough to get your blood pressure under control, and if:  Your systolic blood pressure is 130 or higher.  Your diastolic blood pressure is 80 or higher. Take medicines only as told by your health care provider. Follow the directions carefully. Blood pressure medicines must be taken as prescribed. The medicine does not work as well when you skip doses. Skipping doses also puts you at risk for problems. Contact a health care provider if:  You think you are having a reaction to medicines you have taken.  You have repeated (recurrent) headaches.  You feel dizzy.  You have swelling in your ankles.  You have trouble with your vision. Get help right away if:  You develop a severe headache or confusion.  You have unusual weakness or numbness, or you feel faint.  You have severe pain in your chest or abdomen.  You vomit repeatedly.  You have trouble breathing. Summary  Hypertension is when the force of blood pumping through your arteries is too strong. If this condition is not controlled, it may put you at risk for serious complications.  Your personal target blood pressure may vary depending on your medical conditions, your age, and other   factors. For most people, a normal blood pressure is less than 120/80.  Hypertension is managed by lifestyle changes, medicines, or both. Lifestyle changes include weight loss, eating a healthy, low-sodium diet, exercising more, and limiting alcohol. This information is not intended to replace advice given to you by your health care provider. Make sure you discuss any questions you have with your health care provider. Document Released: 09/10/2012 Document Revised: 11/14/2016 Document Reviewed: 11/14/2016 Elsevier Interactive Patient Education  2019 [employer].

## 2019-03-04 LAB — CBC WITH DIFFERENTIAL/PLATELET
Basophils Absolute: 0.1 10*3/uL (ref 0.0–0.2)
Basos: 2 %
EOS (ABSOLUTE): 0.1 10*3/uL (ref 0.0–0.4)
Eos: 3 %
Hematocrit: 42.2 % (ref 34.0–46.6)
Hemoglobin: 13.8 g/dL (ref 11.1–15.9)
Immature Grans (Abs): 0 10*3/uL (ref 0.0–0.1)
Immature Granulocytes: 0 %
Lymphocytes Absolute: 1.5 10*3/uL (ref 0.7–3.1)
Lymphs: 35 %
MCH: 28.6 pg (ref 26.6–33.0)
MCHC: 32.7 g/dL (ref 31.5–35.7)
MCV: 87 fL (ref 79–97)
Monocytes Absolute: 0.4 10*3/uL (ref 0.1–0.9)
Monocytes: 9 %
Neutrophils Absolute: 2.2 10*3/uL (ref 1.4–7.0)
Neutrophils: 51 %
Platelets: 339 10*3/uL (ref 150–450)
RBC: 4.83 x10E6/uL (ref 3.77–5.28)
RDW: 12.8 % (ref 11.7–15.4)
WBC: 4.3 10*3/uL (ref 3.4–10.8)

## 2019-03-04 LAB — PT AND PTT
INR: 1 (ref 0.8–1.2)
Prothrombin Time: 10.2 s (ref 9.1–12.0)
aPTT: 29 s (ref 24–33)

## 2019-03-05 NOTE — Telephone Encounter (Signed)
Incoming

## 2019-03-06 ENCOUNTER — Encounter: Payer: Self-pay | Admitting: Nurse Practitioner

## 2019-03-12 ENCOUNTER — Telehealth: Payer: Self-pay | Admitting: Nurse Practitioner

## 2019-03-12 ENCOUNTER — Other Ambulatory Visit: Payer: Self-pay | Admitting: Nurse Practitioner

## 2019-03-12 DIAGNOSIS — D219 Benign neoplasm of connective and other soft tissue, unspecified: Secondary | ICD-10-CM

## 2019-03-12 NOTE — Telephone Encounter (Signed)
Pharmacy requesting refills. Thanks!  

## 2019-03-12 NOTE — Telephone Encounter (Signed)
Refill request sent

## 2019-03-12 NOTE — Telephone Encounter (Signed)
Pt needs refills on aviane sent to [facility] [address].  Her call back [phone]

## 2019-06-05 DIAGNOSIS — I10 Essential (primary) hypertension: Secondary | ICD-10-CM | POA: Diagnosis not present

## 2019-06-05 DIAGNOSIS — N939 Abnormal uterine and vaginal bleeding, unspecified: Secondary | ICD-10-CM | POA: Diagnosis not present

## 2019-06-17 DIAGNOSIS — R102 Pelvic and perineal pain: Secondary | ICD-10-CM | POA: Diagnosis not present

## 2019-08-19 NOTE — Progress Notes (Signed)
Gynecology Annual Exam  PCP: Mikey College, NP  Chief Complaint:  Chief Complaint  Patient presents with   Gynecologic Exam    has fibroids; thinks starting menopause; spotting in the wk or two before period    History of Present Illness: Ann Morrow is a 48 y.o.nulliparous BF presents for a NP annual exam. The patient used to be a patient here at [facility] in the early 2000s, but moved to [place] and then back again a few years ago. She has seen [facility] for her care and more recently in April saw Dr Georga Bora for a second opinion regarding the treatment of her fibroids. She had an MRI in [place] and Dr Georga Bora did an ultrasound. She does not have access to either result. Unsure if she may have some submucosal fibroids.An [place] and LH done 11/2018 was normal  Her menses are regular on her OCPs, they occur every month, and they last 6 days. Her flow is moderate. Every other month is heavier and for 2-3 days she has to change her pad every 3 hours.  She has had breakthrough  bleeding on the third week of her pills for the last 6 months. . Her last menstrual period was 07/25/2019. She denies dysmenorrhea.  Last pap smear:12/2016?, results were negative per patient report. Denies hx of abnormal Pap smears   The patient is  sexually active. She currently uses OCPs for contraception. She does not have dyspareunia.  Her past medical history is remarkable for hypertension (has been prescribed HCTZ, but does not take medication)  The patient does perform self breast exams. Her last mammogram was 09/03/2018, results were negative.   There is a family history of breast cancer in her in her maternal grandmother (27) and a paternal cousin(51) Genetic testing has not been done.  There is no family history of ovarian cancer.   The patient denies smoking.  She reports drinking alcohol. She reports have 1 drinks per week.   She denies illegal drug use.  The patient  reports exercising regularly. She walks 30-40 min, 3-4x/week  The patient denies current symptoms of depression.    Review of Systems: ROS  Past Medical History:  Past Medical History:  Diagnosis Date   Allergy    Asthma    Elevated BP without diagnosis of hypertension    Fibroid    History of shingles    History of vertigo     Past Surgical History:  Past Surgical History:  Procedure Laterality Date   HERNIA REPAIR     TONSILLECTOMY AND ADENOIDECTOMY      Family History:  Family History  Problem Relation Age of Onset   Hypertension Mother    Glaucoma Mother    Diabetes Father    Breast cancer Maternal Grandmother 66   Renal cancer Maternal Grandfather    Liver cancer Paternal Grandmother    Cancer Paternal Grandfather    Healthy Brother    Healthy Brother    Breast cancer Cousin 2       paternal side    Social History:  Social History   Socioeconomic History   Marital status: Married    Spouse name: Not on file   Number of children: Not on file   Years of education: Not on file   Highest education level: Not on file  Occupational History   Occupation: Science writer  Social Needs   Financial resource strain: Not hard at all   Food  insecurity    Worry: Never true    Inability: Never true   Transportation needs    Medical: No    Non-medical: No  Tobacco Use   Smoking status: Never Smoker   Smokeless tobacco: Current User  Substance and Sexual Activity   Alcohol use: Yes    Alcohol/week: 1.0 standard drinks    Types: 1 Glasses of wine per week   Drug use: No   Sexual activity: Yes    Birth control/protection: Pill  Lifestyle   Physical activity    Days per week: 2 days    Minutes per session: 50 min   Stress: Not at all  Relationships   Social connections    Talks on phone: More than three times a week    Gets together: More than three times a week    Attends religious service: More than 4 times per year     Active member of club or organization: Yes    Attends meetings of clubs or organizations: More than 4 times per year    Relationship status: Married   Intimate partner violence    Fear of current or ex partner: No    Emotionally abused: No    Physically abused: No    Forced sexual activity: No  Other Topics Concern   Not on file  Social History Narrative   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  In mutually monogamous relationship and married for 17 years (as of 12/11/17).    Allergies:  Allergies  Allergen Reactions   Allegra [Fexofenadine]     Jittery    Latex    Loratadine     REACTION: jittery    Medications: Prior to Admission medications   Medication Sig Start Date End Date Taking? Authorizing Provider  acyclovir (ZOVIRAX) 200 MG capsule Take 200 mg by mouth 5 (five) times daily.    [provider]  AVIANE 0.1-20 MG-MCG tablet TAKE 1 TABLET BY MOUTH DAILY 03/12/19   Mikey College, NP  Bilberry, Vaccinium myrtillus, (BILBERRY FRUIT PO) Take by mouth.    [provider]  cetirizine (ZYRTEC) 10 MG tablet Take by mouth.    [provider]           Physical Exam Vitals: BP (!) 150/90    Pulse 82    Ht 5' 3.5" (1.613 m)    Wt 144 lb 6.4 oz (65.5 kg)    LMP 07/25/2019 (Approximate)    BMI 25.18 kg/m   General: BF in NAD HEENT: normocephalic, anicteric Neck: no thyroid enlargement, no palpable nodules, no cervical lymphadenopathy  Pulmonary: No increased work of breathing, CTAB Cardiovascular: RRR, without murmur  Breast: Breast symmetrical, no tenderness, no palpable nodules or masses, no skin or nipple retraction present, no nipple discharge.  No axillary, infraclavicular or supraclavicular lymphadenopathy. Abdomen: Soft, non-tender, non-distended.  Umbilicus without lesions.  No hepatomegaly. . No evidence of hernia. Large fibroid uterus with nodularity on the right extending above  umbilicus Physical Exam  Abdominal:     Genitourinary:  External: Normal external female genitalia.  Normal urethral meatus, Bartholin's and Skene's glands.    Vagina: Normal vaginal mucosa, no evidence of prolapse, moderate blood in vault    Cervix: Grossly normal in appearance, posterior, non-tender  Uterus:  Large 16-20 week uterus, immobile, NT  Adnexa: No adnexal masses, non-tender  Rectal: deferred  Lymphatic: no evidence of inguinal lymphadenopathy Extremities: no edema, erythema, or tenderness Neurologic: Grossly intact Psychiatric: mood  appropriate, affect full     Assessment: 48 y.o. annual NP exam Large fibroid uterus/ AUB-has been bleeding 2 weeks of each month for the past 6 months Hypertension-untreated  Plan:   1) Fibroid uterus/AUB : Explained various treatments for fibroids: Norethindrone 5-10 mgm daily, POP, Depo Provera, Lupron, IUD, hysterectomy. Dr Leafy Ro has also talked to patient about embolization. Requested that patient get results of ultrasound from Dr Georga Bora. Advised that because of her hypertension she has increased risk of stroke and MI by staying on OCPS. She has decided to try minipill for now for contraception. Discussed how to take the minipill and explained about the 3 hour rule. Follow up in 2-3 months  2) Breast cancer screening - recommend monthly self breast exam. Mammogram is ordered and due after 09/04/2019.  3) Cervical cancer screening - Pap was not done due to bleeding. ASCCP guidelines and rational discussed.  Patient opts for every 3 years screening interval   4) Routine healthcare maintenance including cholesterol and diabetes screening managed by PCP   5) Discussed colon cancer screening: FIT testing, Cologuard, colonoscopy. Patient to consider options.  Dalia Heading, CNM

## 2019-08-20 ENCOUNTER — Encounter: Payer: Self-pay | Admitting: Certified Nurse Midwife

## 2019-08-20 ENCOUNTER — Ambulatory Visit (INDEPENDENT_AMBULATORY_CARE_PROVIDER_SITE_OTHER): Payer: BLUE CROSS/BLUE SHIELD | Admitting: Certified Nurse Midwife

## 2019-08-20 ENCOUNTER — Other Ambulatory Visit: Payer: Self-pay

## 2019-08-20 VITALS — BP 150/90 | HR 82 | Ht 63.5 in | Wt 144.4 lb

## 2019-08-20 DIAGNOSIS — D219 Benign neoplasm of connective and other soft tissue, unspecified: Secondary | ICD-10-CM

## 2019-08-20 DIAGNOSIS — I10 Essential (primary) hypertension: Secondary | ICD-10-CM

## 2019-08-20 DIAGNOSIS — Z1239 Encounter for other screening for malignant neoplasm of breast: Secondary | ICD-10-CM

## 2019-08-20 DIAGNOSIS — Z01419 Encounter for gynecological examination (general) (routine) without abnormal findings: Secondary | ICD-10-CM

## 2019-08-20 MED ORDER — NORETHINDRONE 0.35 MG PO TABS: 1.0000 | ORAL_TABLET | Freq: Every day | ORAL | 11 refills | Status: DC

## 2019-08-20 NOTE — Patient Instructions (Signed)
Please obtain ultrasound and notes from Dr Georga Bora.   Uterine Fibroids  Uterine fibroids (leiomyomas) are noncancerous (benign) tumors that can develop in the uterus. Fibroids may also develop in the fallopian tubes, cervix, or tissues (ligaments) near the uterus. You may have one or many fibroids. Fibroids vary in size, weight, and where they grow in the uterus. Some can become quite large. Most fibroids do not require medical treatment. What are the causes? The cause of this condition is not known. What increases the risk? You are more likely to develop this condition if you:  Are in your 30s or 40s and have not gone through menopause.  Have a family history of this condition.  Are of African-American descent.  Had your first period at an early age (early menarche).  Have not had any children (nulliparity).  Are overweight or obese. What are the signs or symptoms? Many women do not have any symptoms. Symptoms of this condition may include:  Heavy menstrual bleeding.  Bleeding or spotting between periods.  Pain and pressure in the pelvic area, between the hips.  Bladder problems, such as needing to urinate urgently or more often than usual.  Inability to have children (infertility).  Failure to carry pregnancy to term (miscarriage). How is this diagnosed? This condition may be diagnosed based on:  Your symptoms and medical history.  A physical exam.  A pelvic exam that includes feeling for any tumors.  Imaging tests, such as ultrasound or MRI. How is this treated? Treatment for this condition may include:  Seeing your health care provider for follow-up visits to monitor your fibroids for any changes.  Taking NSAIDs such as ibuprofen, naproxen, or aspirin to reduce pain.  Hormone medicines. These may be taken as a pill, given in an injection, or delivered by a T-shaped device that is inserted into the uterus (intrauterine device, IUD).  Surgery to remove  one of the following: ? The fibroids (myomectomy). Your health care provider may recommend this if fibroids affect your fertility and you want to become pregnant. ? The uterus (hysterectomy). ? Blood supply to the fibroids (uterine artery embolization). Follow these instructions at home:  Take over-the-counter and prescription medicines only as told by your health care provider.  Ask your health care provider if you should take iron pills or eat more iron-rich foods, such as dark green, leafy vegetables. Heavy menstrual bleeding can cause low iron levels.  If directed, apply heat to your back or abdomen to reduce pain. Use the heat source that your health care provider recommends, such as a moist heat pack or a heating pad. ? Place a towel between your skin and the heat source. ? Leave the heat on for 20-30 minutes. ? Remove the heat if your skin turns bright red. This is especially important if you are unable to feel pain, heat, or cold. You may have a greater risk of getting burned.  Pay close attention to your menstrual cycle. Tell your health care provider about any changes, such as: ? Increased blood flow that requires you to use more pads or tampons than usual. ? A change in the number of days that your period lasts. ? A change in symptoms that are associated with your period, such as back pain or cramps in your abdomen.  Keep all follow-up visits as told by your health care provider. This is important, especially if your fibroids need to be monitored for any changes. Contact a health care provider if you:  Have pelvic pain, back pain, or cramps in your abdomen that do not get better with medicine or heat.  Develop new bleeding between periods.  Have increased bleeding during or between periods.  Feel unusually tired or weak.  Feel light-headed. Get help right away if you:  Faint.  Have pelvic pain that suddenly gets worse.  Have severe vaginal bleeding that soaks a tampon  or pad in 30 minutes or less. Summary  Uterine fibroids are noncancerous (benign) tumors that can develop in the uterus.  The exact cause of this condition is not known.  Most fibroids do not require medical treatment unless they affect your ability to have children (fertility).  Contact a health care provider if you have pelvic pain, back pain, or cramps in your abdomen that do not get better with medicines.  Make sure you know what symptoms should cause you to get help right away. This information is not intended to replace advice given to you by your health care provider. Make sure you discuss any questions you have with your health care provider. Document Released: 12/14/2000 Document Revised: 11/29/2017 Document Reviewed: 11/12/2017 Elsevier Patient Education  2020 [employer].

## 2019-09-23 ENCOUNTER — Ambulatory Visit
Admission: RE | Admit: 2019-09-23 | Discharge: 2019-09-23 | Disposition: A | Discharge status: Home / Self Care | Payer: BC Managed Care – PPO | Source: Ambulatory Visit | Attending: Certified Nurse Midwife | Admitting: Certified Nurse Midwife

## 2019-09-23 DIAGNOSIS — Z1231 Encounter for screening mammogram for malignant neoplasm of breast: Secondary | ICD-10-CM | POA: Insufficient documentation

## 2019-09-23 DIAGNOSIS — Z1239 Encounter for other screening for malignant neoplasm of breast: Secondary | ICD-10-CM | POA: Diagnosis not present

## 2019-11-20 ENCOUNTER — Encounter: Payer: Self-pay | Admitting: Certified Nurse Midwife

## 2019-11-20 ENCOUNTER — Ambulatory Visit (INDEPENDENT_AMBULATORY_CARE_PROVIDER_SITE_OTHER): Payer: BC Managed Care – PPO | Admitting: Certified Nurse Midwife

## 2019-11-20 ENCOUNTER — Other Ambulatory Visit: Payer: Self-pay

## 2019-11-20 VITALS — BP 120/80 | Ht 63.5 in | Wt 135.0 lb

## 2019-11-20 DIAGNOSIS — Z3041 Encounter for surveillance of contraceptive pills: Secondary | ICD-10-CM | POA: Diagnosis not present

## 2019-11-20 DIAGNOSIS — D219 Benign neoplasm of connective and other soft tissue, unspecified: Secondary | ICD-10-CM

## 2019-11-20 NOTE — Progress Notes (Addendum)
  History of Present Illness:  Ann Morrow is a 48 y.o. who was started on the minipill approximately 3 months ago. Since that time, she states that her symptoms are improving. She was switched from OCPs due to her hypertension. In addition, she has a large fibroid uterus and was having bleeding twice a month on her OCPs. On the first month of her minipill, her menses lasted 7 days and was heavy requiring a pad change every 3-4 hours. She is currently on the end of her second pack of pills and has not had any bleeding. Her blood pressure on the minipill is normal today.  She has brought ultrasound pictures from 2020 taken at [facility] clinic. There is no reading with the pictures, but there appears to be four fibroids measuring 2.4 cm x 1.5cm, 3.4 x 1.9 cm, 3.7 x 4.7 cm, and 5.4 x 3.6cm.  PMHx: She  has a past medical history of Allergy, Asthma, Elevated BP without diagnosis of hypertension, Fibroid, History of shingles, and History of vertigo. Also,  has a past surgical history that includes Tonsillectomy and adenoidectomy and Hernia repair., family history includes Breast cancer (age of onset: 74) in her cousin; Breast cancer (age of onset: 96) in her maternal grandmother; Cancer in her paternal grandfather; Diabetes in her father; Glaucoma in her mother; Healthy in her brother and brother; Hypertension in her mother; Liver cancer in her paternal grandmother; Renal cancer in her maternal grandfather.,  reports that she has never smoked. She uses smokeless tobacco. She reports current alcohol use of about 1.0 standard drinks of alcohol per week. She reports that she does not use drugs.  She has a current medication list which includes the following prescription(s): acyclovir, bilberry (vaccinium myrtillus), cetirizine, and norethindrone. Also, is allergic to allegra [fexofenadine]; latex; and loratadine.  ROS  Physical Exam:  BP 120/80   Ht 5' 3.5" (1.613 m)   Wt 135 lb (61.2 kg)   LMP  10/12/2019 (Approximate)   BMI 23.54 kg/m  Body mass index is 23.54 kg/m. Constitutional: Well nourished, well developed female in no acute distress.   Neuro: Grossly intact Psych:  Normal mood and affect.    Assessment: Doing well thus far on the minipill Fibroid uterus  Plan: She will continue on the minipill for now.. Briefly reviewed treatment options for fibroids if symptomatic RTO for annual and prn.  Dalia Heading, CNM [facility] 11/20/2019  8:44 AM

## 2020-01-06 DIAGNOSIS — Z8619 Personal history of other infectious and parasitic diseases: Secondary | ICD-10-CM | POA: Diagnosis not present

## 2020-01-06 DIAGNOSIS — Z0000 Encounter for general adult medical examination without abnormal findings: Secondary | ICD-10-CM | POA: Diagnosis not present

## 2020-01-06 DIAGNOSIS — D219 Benign neoplasm of connective and other soft tissue, unspecified: Secondary | ICD-10-CM | POA: Diagnosis not present

## 2020-01-06 DIAGNOSIS — Z131 Encounter for screening for diabetes mellitus: Secondary | ICD-10-CM | POA: Diagnosis not present

## 2020-01-06 DIAGNOSIS — Z13220 Encounter for screening for lipoid disorders: Secondary | ICD-10-CM | POA: Diagnosis not present

## 2020-01-06 DIAGNOSIS — I10 Essential (primary) hypertension: Secondary | ICD-10-CM | POA: Diagnosis not present

## 2020-01-15 DIAGNOSIS — Z20828 Contact with and (suspected) exposure to other viral communicable diseases: Secondary | ICD-10-CM | POA: Diagnosis not present

## 2020-01-15 DIAGNOSIS — Z03818 Encounter for observation for suspected exposure to other biological agents ruled out: Secondary | ICD-10-CM | POA: Diagnosis not present

## 2020-01-19 DIAGNOSIS — W540XXA Bitten by dog, initial encounter: Secondary | ICD-10-CM | POA: Diagnosis not present

## 2020-01-19 DIAGNOSIS — S60812A Abrasion of left wrist, initial encounter: Secondary | ICD-10-CM | POA: Diagnosis not present

## 2020-01-21 DIAGNOSIS — Z20828 Contact with and (suspected) exposure to other viral communicable diseases: Secondary | ICD-10-CM | POA: Diagnosis not present

## 2020-05-27 ENCOUNTER — Ambulatory Visit: Payer: BC Managed Care – PPO

## 2020-07-26 ENCOUNTER — Telehealth: Payer: Self-pay

## 2020-07-26 MED ORDER — NORETHINDRONE 0.35 MG PO TABS: 1.0000 | ORAL_TABLET | Freq: Every day | ORAL | 0 refills | Status: DC

## 2020-07-26 NOTE — Telephone Encounter (Signed)
Pt left msg on triage asking for [facility] RF Norlyda. I told her Rx on file that was sent was calling for norethindrone and most likely she got something similar to what was sent. RF sent and she will ask at pharmacy for [facility] brand since it works best for her.

## 2020-08-24 ENCOUNTER — Ambulatory Visit (INDEPENDENT_AMBULATORY_CARE_PROVIDER_SITE_OTHER): Payer: BC Managed Care – PPO | Admitting: Obstetrics and Gynecology

## 2020-08-24 ENCOUNTER — Other Ambulatory Visit: Payer: Self-pay

## 2020-08-24 ENCOUNTER — Encounter: Payer: Self-pay | Admitting: Obstetrics and Gynecology

## 2020-08-24 VITALS — BP 132/90 | Ht 63.0 in | Wt 142.0 lb

## 2020-08-24 DIAGNOSIS — D219 Benign neoplasm of connective and other soft tissue, unspecified: Secondary | ICD-10-CM | POA: Diagnosis not present

## 2020-08-24 DIAGNOSIS — Z01419 Encounter for gynecological examination (general) (routine) without abnormal findings: Secondary | ICD-10-CM | POA: Diagnosis not present

## 2020-08-24 DIAGNOSIS — Z1211 Encounter for screening for malignant neoplasm of colon: Secondary | ICD-10-CM

## 2020-08-24 DIAGNOSIS — Z1231 Encounter for screening mammogram for malignant neoplasm of breast: Secondary | ICD-10-CM | POA: Diagnosis not present

## 2020-08-24 DIAGNOSIS — Z3041 Encounter for surveillance of contraceptive pills: Secondary | ICD-10-CM

## 2020-08-24 MED ORDER — NORETHINDRONE 0.35 MG PO TABS: 1.0000 | ORAL_TABLET | Freq: Every day | ORAL | 3 refills | Status: DC

## 2020-08-24 NOTE — Patient Instructions (Addendum)
I value your feedback and entrusting us with your care. If you get a  patient survey, I would appreciate you taking the time to let us know about your experience today. Thank you! ° °As of December 10, 2019, your lab results will be released to your MyChart immediately, before I even have a chance to see them. Please give me time to review them and contact you if there are any abnormalities. Thank you for your patience.  ° °[facility]: [phone] ° ° ° °

## 2020-08-24 NOTE — Progress Notes (Signed)
PCP: Wayland Denis, PA-C   Chief Complaint  Patient presents with  . Gynecologic Exam    HPI:      Ms. Ann Morrow is a 49 y.o. G0P0000 whose LMP was Patient's last menstrual period was 08/11/2020 (approximate)., presents today for her annual examination.  Her menses are random spotting for 3 days with POPs. LMP was 10 days, heavier flow but still light. No dysmen, no BTB.  No vasomotor sx. She has a hx of four fibroids measuring 2.4 cm x 1.5cm, 3.4 x 1.9 cm, 3.7 x 4.7 cm, and 5.4 x 3.6cm on u/s. Pt notices fullness/min discomfort at times with leio. No bowel, bladder issues except for some urgency. Would prefer to not have surg for leio.  Sex activity: single partner, contraception - oral progesterone-only contraceptive. Has occas dyspareunia due to leio. She does not have vaginal dryness.   Last Pap: 01/28/17  Results were: no abnormalities /neg HPV DNA.   Last mammogram: 09/23/19  Results were: normal--routine follow-up in 12 months There is a FH of breast cancer in her PGM, genetic testing not indicated. There is no FH of ovarian cancer. The patient does do self-breast exams.  Colonoscopy: never  Tobacco use: The patient denies current or previous tobacco use. Alcohol use: social drinker  No drug use Exercise: moderately active  She does get adequate calcium and Vitamin D in her diet.  Labs with PCP.   Past Medical History:  Diagnosis Date  . Allergy   . Asthma   . Elevated BP without diagnosis of hypertension   . Fibroid   . History of shingles   . History of vertigo     Past Surgical History:  Procedure Laterality Date  . HERNIA REPAIR    . TONSILLECTOMY AND ADENOIDECTOMY      Family History  Problem Relation Age of Onset  . Hypertension Mother   . Glaucoma Mother   . Diabetes Father   . Breast cancer Maternal Grandmother 23  . Renal cancer Maternal Grandfather   . Liver cancer Paternal Grandmother   . Cancer Paternal Grandfather   . Healthy  Brother   . Healthy Brother   . Breast cancer Cousin 47       paternal side    Social History   Socioeconomic History  . Marital status: Married    Spouse name: Not on file  . Number of children: Not on file  . Years of education: Not on file  . Highest education level: Not on file  Occupational History  . Occupation: Banking  Tobacco Use  . Smoking status: Never Smoker  . Smokeless tobacco: Current User  Vaping Use  . Vaping Use: Never used  Substance and Sexual Activity  . Alcohol use: Yes    Alcohol/week: 1.0 standard drink    Types: 1 Glasses of wine per week  . Drug use: No  . Sexual activity: Yes    Birth control/protection: Pill  Other Topics Concern  . 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  In mutually monogamous relationship and married for 17 years (as of 12/11/17).   Social Determinants of Health   Financial Resource Strain:   . Difficulty of Paying Living Expenses: Not on file  Food Insecurity:   . Worried About Charity fundraiser in the Last Year: Not on file  . Ran Out of Food in the Last Year: Not on file  Transportation Needs:   . Film/video editor (Medical): Not on file  . Lack of Transportation (Non-Medical): Not on file  Physical Activity:   . Days of Exercise per Week: Not on file  . Minutes of Exercise per Session: Not on file  Stress:   . Feeling of Stress : Not on file  Social Connections:   . Frequency of Communication with Friends and Family: Not on file  . Frequency of Social Gatherings with Friends and Family: Not on file  . Attends Religious Services: Not on file  . Active Member of Clubs or Organizations: Not on file  . Attends Archivist Meetings: Not on file  . Marital Status: Not on file  Intimate Partner Violence:   . Fear of Current or Ex-Partner: Not on file  . Emotionally Abused: Not on file  . Physically Abused: Not on file  .  Sexually Abused: Not on file     Current Outpatient Medications:  .  cetirizine (ZYRTEC) 10 MG tablet, Take by mouth., Disp: , Rfl:  .  norethindrone (MICRONOR) 0.35 MG tablet, Take 1 tablet (0.35 mg total) by mouth daily., Disp: 84 tablet, Rfl: 3     ROS:  Review of Systems  Constitutional: Negative for fatigue, fever and unexpected weight change.  Respiratory: Negative for cough, shortness of breath and wheezing.   Cardiovascular: Negative for chest pain, palpitations and leg swelling.  Gastrointestinal: Negative for blood in stool, constipation, diarrhea, nausea and vomiting.  Endocrine: Negative for cold intolerance, heat intolerance and polyuria.  Genitourinary: Negative for dyspareunia, dysuria, flank pain, frequency, genital sores, hematuria, menstrual problem, pelvic pain, urgency, vaginal bleeding, vaginal discharge and vaginal pain.  Musculoskeletal: Negative for back pain, joint swelling and myalgias.  Skin: Negative for rash.  Neurological: Negative for dizziness, syncope, light-headedness, numbness and headaches.  Hematological: Negative for adenopathy.  Psychiatric/Behavioral: Negative for agitation, confusion, sleep disturbance and suicidal ideas. The patient is not nervous/anxious.    BREAST: No symptoms    Objective: BP 132/90   Ht 5\' 3"  (1.6 m)   Wt 142 lb (64.4 kg)   LMP 08/11/2020 (Approximate)   BMI 25.15 kg/m    Physical Exam Constitutional:      Appearance: She is well-developed.  Genitourinary:     Vulva, vagina, cervix, right adnexa and left adnexa normal.     No vulval lesion or tenderness noted.     No vaginal discharge, erythema or tenderness.     No cervical polyp.     Uterus is enlarged.     Uterus is not tender.     No right or left adnexal mass present.     Right adnexa not tender.     Left adnexa not tender.     Genitourinary Comments: UTERINE SIZE SEEMS C/W SIZE AT ANNUAL LAST YR  Neck:     Thyroid: No thyromegaly.    Cardiovascular:     Rate and Rhythm: Normal rate and regular rhythm.     Heart sounds: Normal heart sounds. No murmur heard.   Pulmonary:     Effort: Pulmonary effort is normal.     Breath sounds: Normal breath sounds.  Chest:     Breasts:        Right: No mass, nipple discharge, skin change or tenderness.        Left: No mass, nipple discharge, skin change or tenderness.  Abdominal:     Palpations: Abdomen is soft. There is mass.     Tenderness: There  is no abdominal tenderness. There is no guarding or rebound.    Musculoskeletal:        General: Normal range of motion.     Cervical back: Normal range of motion.  Neurological:     General: No focal deficit present.     Mental Status: She is alert and oriented to person, place, and time.     Cranial Nerves: No cranial nerve deficit.  Skin:    General: Skin is warm and dry.  Psychiatric:        Mood and Affect: Mood normal.        Behavior: Behavior normal.        Thought Content: Thought content normal.        Judgment: Judgment normal.  Vitals reviewed.     Assessment/Plan:  Encounter for annual routine gynecological examination  Encounter for surveillance of contraceptive pills - Plan: norethindrone (MICRONOR) 0.35 MG tablet; POP RF.  Leiomyoma--bleeding controlled with POPs. Discussed [place] since pt is having some discomfort and would rather not have surg. Pt to f/u if desires ref to [facility].  Encounter for screening mammogram for malignant neoplasm of breast - Plan: MM 3D SCREEN BREAST BILATERAL; pt to sched mammo  Screening for colon cancer - Plan: Ambulatory referral to Gastroenterology; refer to GI.   Meds ordered this encounter  Medications  . norethindrone (MICRONOR) 0.35 MG tablet    Sig: Take 1 tablet (0.35 mg total) by mouth daily.    Dispense:  84 tablet    Refill:  3    Order Specific Question:   Supervising Provider    Answer:   Gae Dry [[id number]]            GYN counsel breast  self exam, mammography screening, menopause, adequate intake of calcium and vitamin D, diet and exercise    F/U  Return in about 1 year (around 08/24/2021).  Jezel Basto B. Suleima Ohlendorf, PA-C 08/24/2020 4:06 PM

## 2020-09-07 ENCOUNTER — Telehealth (INDEPENDENT_AMBULATORY_CARE_PROVIDER_SITE_OTHER): Payer: Self-pay | Admitting: Gastroenterology

## 2020-09-07 ENCOUNTER — Other Ambulatory Visit: Payer: Self-pay

## 2020-09-07 DIAGNOSIS — Z1211 Encounter for screening for malignant neoplasm of colon: Secondary | ICD-10-CM

## 2020-09-07 MED ORDER — NA SULFATE-K SULFATE-MG SULF 17.5-3.13-1.6 GM/177ML PO SOLN: 1.0000 | Freq: Once | ORAL | 0 refills | Status: AC

## 2020-09-07 NOTE — Progress Notes (Signed)
Gastroenterology Pre-Procedure Review  Request Date: Tuesday 10/11/20 Requesting Physician: Dr. Allen Norris  PATIENT REVIEW QUESTIONS: The patient responded to the following health history questions as indicated:    1. Are you having any GI issues? no 2. Do you have a personal history of Polyps? no 3. Do you have a family history of Colon Cancer or Polyps? yes (yes mother and father colon polyps) 4. Diabetes Mellitus? no 5. Joint replacements in the past 12 months?no 6. Major health problems in the past 3 months?no 7. Any artificial heart valves, MVP, or defibrillator?no    MEDICATIONS & ALLERGIES:    Patient reports the following regarding taking any anticoagulation/antiplatelet therapy:   Plavix, Coumadin, Eliquis, Xarelto, Lovenox, Pradaxa, Brilinta, or Effient? no Aspirin? no  Patient confirms/reports the following medications:  Current Outpatient Medications  Medication Sig Dispense Refill   cetirizine (ZYRTEC) 10 MG tablet Take by mouth.     norethindrone (MICRONOR) 0.35 MG tablet Take 1 tablet (0.35 mg total) by mouth daily. 84 tablet 3   Na Sulfate-K Sulfate-Mg Sulf 17.5-3.13-1.6 GM/177ML SOLN Take 1 kit by mouth once for 1 dose. 354 mL 0   No current facility-administered medications for this visit.    Patient confirms/reports the following allergies:  Allergies  Allergen Reactions   Allegra [Fexofenadine]     Jittery    Latex    Loratadine     REACTION: jittery    No orders of the defined types were placed in this encounter.   AUTHORIZATION INFORMATION Primary Insurance: 1D#: Group #:  Secondary Insurance: 1D#: Group #:  SCHEDULE INFORMATION: Date: Tuesday 10/11/20 Time: Location:[facility]

## 2020-09-13 ENCOUNTER — Telehealth: Payer: Self-pay

## 2020-09-13 NOTE — Telephone Encounter (Signed)
Patients call has been returned in regards to canceling her colonoscopy with our office.  She states that she wanted to go to the GI Physician that performed her mothers colonoscopy which happens to be at [facility].  She said since her PCP is with [facility] she is planning to stay within the [facility].  Thanks,  [place], [place]

## 2020-09-28 ENCOUNTER — Other Ambulatory Visit: Payer: Self-pay

## 2020-09-28 ENCOUNTER — Ambulatory Visit
Admission: RE | Admit: 2020-09-28 | Discharge: 2020-09-28 | Disposition: A | Discharge status: Home / Self Care | Payer: BC Managed Care – PPO | Source: Ambulatory Visit | Attending: Obstetrics and Gynecology | Admitting: Obstetrics and Gynecology

## 2020-09-28 DIAGNOSIS — Z1231 Encounter for screening mammogram for malignant neoplasm of breast: Secondary | ICD-10-CM

## 2020-10-10 ENCOUNTER — Telehealth: Payer: Self-pay | Admitting: Obstetrics and Gynecology

## 2020-10-10 ENCOUNTER — Ambulatory Visit: Payer: BC Managed Care – PPO

## 2020-10-10 ENCOUNTER — Ambulatory Visit (INDEPENDENT_AMBULATORY_CARE_PROVIDER_SITE_OTHER): Payer: BC Managed Care – PPO | Admitting: Obstetrics & Gynecology

## 2020-10-10 ENCOUNTER — Encounter: Payer: Self-pay | Admitting: Obstetrics & Gynecology

## 2020-10-10 ENCOUNTER — Other Ambulatory Visit: Payer: Self-pay

## 2020-10-10 VITALS — BP 120/80 | Ht 63.5 in | Wt 141.0 lb

## 2020-10-10 DIAGNOSIS — D219 Benign neoplasm of connective and other soft tissue, unspecified: Secondary | ICD-10-CM | POA: Diagnosis not present

## 2020-10-10 NOTE — Telephone Encounter (Signed)
We'll wait to see what ultrasound shows first. Thx

## 2020-10-10 NOTE — Telephone Encounter (Signed)
Patient was seen today for office visit with [facility] ([address] Patient is scheduled for ultrasound at 2 pm but wants [facility] to follow up on ultrasound. Patient is scheduledfor 10/12/20 at 4:30. Please advise if patient needs to keep appointment

## 2020-10-10 NOTE — Progress Notes (Signed)
Uterine Fibroids Patient presents with uterine fibroids. Periods are irreg on POP for about a year for birth control/period control.  Dysmenorrhea:she is having pain and pressure off and on, more so over the last 6 mos than before, also more episodes of dyspareunia and low back pain.  She has increased pressure and force of stream w urination.  She denies incontinence and frequency.  No intermenstrual bleeding, spotting, or discharge.  She has known fibroids of many years, last [place] at another doctors office ([facility], no pic or forms available) in 2020.  She moved here from [place] 3 years ago.  She prefers no surgery, and was monitoring fibroid sx's w hopes of menopause alleviating sx's over time.  PMHx: She  has a past medical history of Allergy, Asthma, Elevated BP without diagnosis of hypertension, Fibroid, History of shingles, and History of vertigo. Also,  has a past surgical history that includes Tonsillectomy and adenoidectomy and Hernia repair., family history includes Breast cancer (age of onset: 12) in her cousin; Breast cancer (age of onset: 39) in her maternal grandmother; Cancer in her paternal grandfather; Diabetes in her father; Glaucoma in her mother; Healthy in her brother and brother; Hypertension in her mother; Liver cancer in her paternal grandmother; Renal cancer in her maternal grandfather.,  reports that she has never smoked. She uses smokeless tobacco. She reports current alcohol use of about 1.0 standard drink of alcohol per week. She reports that she does not use drugs.  She has a current medication list which includes the following prescription(s): cetirizine and norethindrone. Also, is allergic to allegra [fexofenadine], latex, and loratadine.  Review of Systems  Constitutional: Negative for chills, fever and malaise/fatigue.  HENT: Negative for congestion, sinus pain and sore throat.   Eyes: Negative for blurred vision and pain.  Respiratory: Negative for cough and wheezing.     Cardiovascular: Negative for chest pain and leg swelling.  Gastrointestinal: Positive for abdominal pain. Negative for constipation, diarrhea, heartburn, nausea and vomiting.  Genitourinary: Negative for dysuria, frequency, hematuria and urgency.  Musculoskeletal: Negative for back pain, joint pain, myalgias and neck pain.  Skin: Negative for itching and rash.  Neurological: Negative for dizziness, tremors and weakness.  Endo/Heme/Allergies: Does not bruise/bleed easily.  Psychiatric/Behavioral: Negative for depression. The patient is not nervous/anxious and does not have insomnia.     Objective: BP 120/80    Ht 5' 3.5" (1.613 m)    Wt 141 lb (64 kg)    BMI 24.59 kg/m  Physical Exam Constitutional:      General: She is not in acute distress.    Appearance: She is well-developed.  Musculoskeletal:        General: Normal range of motion.  Neurological:     Mental Status: She is alert and oriented to person, place, and time.  Skin:    General: Skin is warm and dry.  Vitals reviewed.    ASSESSMENT/PLAN:    Problem List Items Addressed This Visit      Other   Fibroid - Primary   Relevant Orders   US PELVIC COMPLETE WITH TRANSVAGINAL   US PELVIS (TRANSABDOMINAL ONLY)    Plan our own [place] to assess size now and for future monitoring  Fibroid treatment such as [place], Lupron, Myomectomy, and Hysterectomy discussed in detail, with the pros and cons of each choice counseled.  No treatment as an option also discussed, as well as control of symptoms alone with hormone therapy. Information provided to the patient.  She may still  consider [place] and even Lupron as options.    A total of 20 minutes were spent face-to-face with the patient as well as preparation, review, communication, and documentation during this encounter.   Barnett Applebaum, MD, [facility], [facility] 10/10/2020  8:31 AM

## 2020-10-11 ENCOUNTER — Ambulatory Visit: Payer: BC Managed Care – PPO

## 2020-10-11 ENCOUNTER — Ambulatory Visit: Admit: 2020-10-11 | Payer: BC Managed Care – PPO | Admitting: Gastroenterology

## 2020-10-11 SURGERY — COLONOSCOPY WITH PROPOFOL
Anesthesia: General

## 2020-10-11 NOTE — Telephone Encounter (Signed)
Patient cancelled appointment due to insurance have high deductible. Patient will call back to rescheduled when she knows she can afford u/s

## 2020-10-11 NOTE — Telephone Encounter (Signed)
Ok

## 2020-10-12 ENCOUNTER — Ambulatory Visit: Payer: BC Managed Care – PPO | Admitting: Obstetrics and Gynecology

## 2020-10-27 ENCOUNTER — Other Ambulatory Visit: Payer: BC Managed Care – PPO

## 2020-10-27 ENCOUNTER — Ambulatory Visit: Payer: BC Managed Care – PPO | Admitting: Obstetrics and Gynecology

## 2020-11-16 DIAGNOSIS — R0789 Other chest pain: Secondary | ICD-10-CM | POA: Diagnosis not present

## 2021-01-03 ENCOUNTER — Other Ambulatory Visit: Payer: Self-pay

## 2021-01-03 ENCOUNTER — Ambulatory Visit: Payer: BC Managed Care – PPO | Attending: Internal Medicine

## 2021-01-03 DIAGNOSIS — Z23 Encounter for immunization: Secondary | ICD-10-CM

## 2021-01-03 NOTE — Progress Notes (Signed)
   Covid-19 Vaccination Clinic  Name:  Ann Morrow    MRN: [id number] DOB: 06/07/1971  01/03/2021  [name] was observed post Covid-19 immunization for 15 minutes without incident. She was provided with Vaccine Information Sheet and instruction to access the V-Safe system.   [name] was instructed to call 911 with any severe reactions post vaccine: Marland Kitchen Difficulty breathing  . Swelling of face and throat  . A fast heartbeat  . A bad rash all over body  . Dizziness and weakness   Immunizations Administered    Name Date Dose VIS Date Route   Pfizer COVID-19 Vaccine 01/03/2021  2:17 PM 0.3 mL 10/19/2020 Intramuscular   Manufacturer: [employer], [employer]   Lot: [id number]   NDC: [address]-3

## 2021-06-21 LAB — EXTERNAL GENERIC LAB PROCEDURE: COLOGUARD: NEGATIVE

## 2021-06-21 LAB — COLOGUARD: COLOGUARD: NEGATIVE

## 2021-08-21 DIAGNOSIS — Z20822 Contact with and (suspected) exposure to covid-19: Secondary | ICD-10-CM | POA: Diagnosis not present

## 2021-08-21 DIAGNOSIS — Z03818 Encounter for observation for suspected exposure to other biological agents ruled out: Secondary | ICD-10-CM | POA: Diagnosis not present

## 2021-09-12 ENCOUNTER — Encounter: Payer: Self-pay | Admitting: Obstetrics and Gynecology

## 2021-09-12 ENCOUNTER — Ambulatory Visit (INDEPENDENT_AMBULATORY_CARE_PROVIDER_SITE_OTHER): Payer: BC Managed Care – PPO | Admitting: Obstetrics and Gynecology

## 2021-09-12 ENCOUNTER — Other Ambulatory Visit: Payer: Self-pay

## 2021-09-12 VITALS — BP 145/98 | HR 80 | Resp 16 | Ht 63.5 in | Wt 146.2 lb

## 2021-09-12 DIAGNOSIS — Z7689 Persons encountering health services in other specified circumstances: Secondary | ICD-10-CM

## 2021-09-12 DIAGNOSIS — Z3041 Encounter for surveillance of contraceptive pills: Secondary | ICD-10-CM | POA: Diagnosis not present

## 2021-09-12 DIAGNOSIS — Z01419 Encounter for gynecological examination (general) (routine) without abnormal findings: Secondary | ICD-10-CM

## 2021-09-12 DIAGNOSIS — N941 Unspecified dyspareunia: Secondary | ICD-10-CM

## 2021-09-12 DIAGNOSIS — D219 Benign neoplasm of connective and other soft tissue, unspecified: Secondary | ICD-10-CM

## 2021-09-12 DIAGNOSIS — R102 Pelvic and perineal pain: Secondary | ICD-10-CM

## 2021-09-12 NOTE — Patient Instructions (Signed)
Breast Self-Awareness Breast self-awareness is knowing how your breasts look and feel. Doing breast self-awareness is important. It allows you to catch a breast problem early while it is still small and can be treated. All women should do breast self-awareness, including women who have had breast implants. Tell your doctor if you notice a change in your breasts. What you need: A mirror. A well-lit room. How to do a breast self-exam A breast self-exam is one way to learn what is normal for your breasts and to check for changes. To do a breast self-exam: Look for changes  Take off all the clothes above your waist. Stand in front of a mirror in a room with good lighting. Put your hands on your hips. Push your hands down. Look at your breasts and nipples in the mirror to see if one breast or nipple looks different from the other. Check to see if: The shape of one breast is different. The size of one breast is different. There are wrinkles, dips, and bumps in one breast and not the other. Look at each breast for changes in the skin, such as: Redness. Scaly areas. Look for changes in your nipples, such as: Liquid around the nipples. Bleeding. Dimpling. Redness. A change in where the nipples are. Feel for changes  Lie on your back on the floor. Feel each breast. To do this, follow these steps: Pick a breast to feel. Put the arm closest to that breast above your head. Use your other arm to feel the nipple area of your breast. Feel the area with the pads of your three middle fingers by making small circles with your fingers. For the first circle, press lightly. For the second circle, press harder. For the third circle, press even harder. Keep making circles with your fingers at the different pressures as you move down your breast. Stop when you feel your ribs. Move your fingers a little toward the center of your body. Start making circles with your fingers again, this time going up until  you reach your collarbone. Keep making up-and-down circles until you reach your armpit. Remember to keep using the three pressures. Feel the other breast in the same way. Sit or stand in the tub or shower. With soapy water on your skin, feel each breast the same way you did in step 2 when you were lying on the floor. Write down what you find Writing down what you find can help you remember what to tell your doctor. Write down: What is normal for each breast. Any changes you find in each breast, including: The kind of changes you find. Whether you have pain. Size and location of any lumps. When you last had your menstrual period. General tips Check your breasts every month. If you are breastfeeding, the best time to check your breasts is after you feed your baby or after you use a breast pump. If you get menstrual periods, the best time to check your breasts is 5-7 days after your menstrual period is over. With time, you will become comfortable with the self-exam, and you will begin to know if there are changes in your breasts. Contact a doctor if you: See a change in the shape or size of your breasts or nipples. See a change in the skin of your breast or nipples, such as red or scaly skin. Have fluid coming from your nipples that is not normal. Find a lump or thick area that was not there before. Have pain in   your breasts. Have any concerns about your breast health. Summary Breast self-awareness includes looking for changes in your breasts, as well as feeling for changes within your breasts. Breast self-awareness should be done in front of a mirror in a well-lit room. You should check your breasts every month. If you get menstrual periods, the best time to check your breasts is 5-7 days after your menstrual period is over. Let your doctor know of any changes you see in your breasts, including changes in size, changes on the skin, pain or tenderness, or fluid from your nipples that is not  normal. This information is not intended to replace advice given to you by your health care provider. Make sure you discuss any questions you have with your health care provider. Document Revised: 08/05/2018 Document Reviewed: 08/05/2018 Elsevier Patient Education  2022 Elsevier Inc.    Preventive Care 40-64 Years Old, Female Preventive care refers to lifestyle choices and visits with your health care provider that can promote health and wellness. This includes: A yearly physical exam. This is also called an annual wellness visit. Regular dental and eye exams. Immunizations. Screening for certain conditions. Healthy lifestyle choices, such as: Eating a healthy diet. Getting regular exercise. Not using drugs or products that contain nicotine and tobacco. Limiting alcohol use. What can I expect for my preventive care visit? Physical exam Your health care provider will check your: Height and weight. These may be used to calculate your BMI (body mass index). BMI is a measurement that tells if you are at a healthy weight. Heart rate and blood pressure. Body temperature. Skin for abnormal spots. Counseling Your health care provider may ask you questions about your: Past medical problems. Family's medical history. Alcohol, tobacco, and drug use. Emotional well-being. Home life and relationship well-being. Sexual activity. Diet, exercise, and sleep habits. Work and work environment. Access to firearms. Method of birth control. Menstrual cycle. Pregnancy history. What immunizations do I need? Vaccines are usually given at various ages, according to a schedule. Your health care provider will recommend vaccines for you based on your age, medical history, and lifestyle or other factors, such as travel or where you work. What tests do I need? Blood tests Lipid and cholesterol levels. These may be checked every 5 years, or more often if you are over 50 years old. Hepatitis C  test. Hepatitis B test. Screening Lung cancer screening. You may have this screening every year starting at age 55 if you have a 30-pack-year history of smoking and currently smoke or have quit within the past 15 years. Colorectal cancer screening. All adults should have this screening starting at age 50 and continuing until age 75. Your health care provider may recommend screening at age 45 if you are at increased risk. You will have tests every 1-10 years, depending on your results and the type of screening test. Diabetes screening. This is done by checking your blood sugar (glucose) after you have not eaten for a while (fasting). You may have this done every 1-3 years. Mammogram. This may be done every 1-2 years. Talk with your health care provider about when you should start having regular mammograms. This may depend on whether you have a family history of breast cancer. BRCA-related cancer screening. This may be done if you have a family history of breast, ovarian, tubal, or peritoneal cancers. Pelvic exam and Pap test. This may be done every 3 years starting at age 21. Starting at age 30, this may be done every   5 years if you have a Pap test in combination with an HPV test. Other tests STD (sexually transmitted disease) testing, if you are at risk. Bone density scan. This is done to screen for osteoporosis. You may have this scan if you are at high risk for osteoporosis. Talk with your health care provider about your test results, treatment options, and if necessary, the need for more tests. Follow these instructions at home: Eating and drinking  Eat a diet that includes fresh fruits and vegetables, whole grains, lean protein, and low-fat dairy products. Take vitamin and mineral supplements as recommended by your health care provider. Do not drink alcohol if: Your health care provider tells you not to drink. You are pregnant, may be pregnant, or are planning to become pregnant. If  you drink alcohol: Limit how much you have to 0-1 drink a day. Be aware of how much alcohol is in your drink. In the [place] one drink equals one 12 oz bottle of beer (355 mL), one 5 oz glass of wine (148 mL), or one 1 oz glass of hard liquor (44 mL). Lifestyle Take daily care of your teeth and gums. Brush your teeth every morning and night with fluoride toothpaste. Floss one time each day. Stay active. Exercise for at least 30 minutes 5 or more days each week. Do not use any products that contain nicotine or tobacco, such as cigarettes, e-cigarettes, and chewing tobacco. If you need help quitting, ask your health care provider. Do not use drugs. If you are sexually active, practice safe sex. Use a condom or other form of protection to prevent STIs (sexually transmitted infections). If you do not wish to become pregnant, use a form of birth control. If you plan to become pregnant, see your health care provider for a prepregnancy visit. If told by your health care provider, take low-dose aspirin daily starting at age 50. Find healthy ways to cope with stress, such as: Meditation, yoga, or listening to music. Journaling. Talking to a trusted person. Spending time with friends and family. Safety Always wear your seat belt while driving or riding in a vehicle. Do not drive: If you have been drinking alcohol. Do not ride with someone who has been drinking. When you are tired or distracted. While texting. Wear a helmet and other protective equipment during sports activities. If you have firearms in your house, make sure you follow all gun safety procedures. What's next? Visit your health care provider once a year for an annual wellness visit. Ask your health care provider how often you should have your eyes and teeth checked. Stay up to date on all vaccines. This information is not intended to replace advice given to you by your health care provider. Make sure you discuss any questions you have  with your health care provider. Document Revised: 02/24/2021 Document Reviewed: 08/28/2018 Elsevier Patient Education  2022 Elsevier Inc.  

## 2021-09-12 NOTE — Progress Notes (Signed)
GYNECOLOGY ANNUAL PHYSICAL EXAM PROGRESS NOTE  Subjective:    Ann Morrow is a 50 y.o. G0P0000 female who presents for an annual exam. Relocated back to [place] approximately 3 years ago, moved from [place], [place]. Has a history of uterine fibroids. The patient has no complaints today. The patient is sexually active. The patient participates in regular exercise: yes, she walks. Has the patient ever been transfused or tattooed?: no. The patient reports that there is not domestic violence in her life.   Complaints or Concerns today:  She has a hx of fibroids. She feels discomfort and pain at times. She is also has feeling of abdominal pressure and fullness. Sometimes makes sex uncomfortable. She would like to make sure everything is ok. Has been "some time" since her fibroids have checked. Also is noting some intermittent spotting x1 month, but has not had a full cycle in 1.5 years. Currently on OCPs (progesterone-only). Has been several years since she has had her fibroids evaluated.    Gynecologic History:  Menarche age: 29 No LMP recorded (lmp unknown). (Menstrual status: Oral contraceptives). Contraception: oral progesterone-only contraceptive History of STI's: Denies Last Pap: 01/28/2017. Results were: normal.  Denies h/o abnormal pap smears. Last mammogram: 09/28/2020. Results were: normal Colon screening: Performed Cologuard 3 months ago.     Upstream - 09/12/21 1532       Pregnancy Intention Screening   Does the patient want to become pregnant in the next year? No    Does the patient's partner want to become pregnant in the next year? No    Would the patient like to discuss contraceptive options today? No      Contraception Wrap Up   Current Method Oral Contraceptive    End Method Oral Contraceptive    Contraception Counseling Provided No            The pregnancy intention screening data noted above was reviewed. Potential methods of contraception were  discussed. The patient elected to proceed with Oral Contraceptive.   The pregnancy intention screening data noted above was reviewed. Potential methods of contraception were discussed. The patient elected to proceed with Oral Contraceptive.    OB History  Gravida Para Term Preterm AB Living  0 0 0 0 0 0  SAB IAB Ectopic Multiple Live Births  0 0 0 0 0    Past Medical History:  Diagnosis Date   Allergy    Asthma    Elevated BP without diagnosis of hypertension    Fibroid    History of shingles    History of vertigo     Past Surgical History:  Procedure Laterality Date   HERNIA REPAIR     TONSILLECTOMY AND ADENOIDECTOMY      Family History  Problem Relation Age of Onset   Hypertension Mother    Glaucoma Mother    Diabetes Father    Breast cancer Maternal Grandmother 60   Renal cancer Maternal Grandfather    Liver cancer Paternal Grandmother    Cancer Paternal Grandfather    Healthy Brother    Healthy Brother    Breast cancer Cousin 72       paternal side    Social History   Socioeconomic History   Marital status: Married    Spouse name: Not on file   Number of children: Not on file   Years of education: Not on file   Highest education level: Not on file  Occupational History   Occupation: Banking  Tobacco  Use   Smoking status: Never   Smokeless tobacco: Current  Vaping Use   Vaping Use: Never used  Substance and Sexual Activity   Alcohol use: Yes    Alcohol/week: 1.0 standard drink    Types: 1 Glasses of wine per week   Drug use: No   Sexual activity: Yes    Birth control/protection: Pill  Other Topics Concern   Not on file  Social History Narrative   Patient is Jehovah's Witness who refuses whole blood product transfusion.  In mutually monogamous relationship and married for 17 years (as of 12/11/17).   Social Determinants of Health   Financial Resource Strain: Not on file  Food Insecurity: Not on file  Transportation Needs: Not on file   Physical Activity: Not on file  Stress: Not on file  Social Connections: Not on file  Intimate Partner Violence: Not on file    Current Outpatient Medications on File Prior to Visit  Medication Sig Dispense Refill   cetirizine (ZYRTEC) 10 MG tablet Take by mouth.     Multiple Vitamin (MULTI-VITAMIN) tablet Take 1 tablet by mouth daily.     norethindrone (MICRONOR) 0.35 MG tablet Take 1 tablet (0.35 mg total) by mouth daily. 84 tablet 3   No current facility-administered medications on file prior to visit.    Allergies  Allergen Reactions   Allegra [Fexofenadine]     Jittery    Latex    Loratadine     REACTION: jittery     Review of Systems Constitutional: negative for chills, fatigue, fevers and sweats Eyes: negative for irritation, redness and visual disturbance Ears, nose, mouth, throat, and face: negative for hearing loss, nasal congestion, snoring and tinnitus Respiratory: negative for asthma, cough, sputum Cardiovascular: negative for chest pain, dyspnea, exertional chest pressure/discomfort, irregular heart beat, palpitations and syncope Gastrointestinal: negative for abdominal pain, change in bowel habits, nausea and vomiting Genitourinary: negative for abnormal menstrual periods (but does note occasional spotting x 1 month), genital lesions, sexual problems and vaginal discharge, dysuria and urinary incontinence Integument/breast: negative for breast lump, breast tenderness and nipple discharge Hematologic/lymphatic: negative for bleeding and easy bruising Musculoskeletal:negative for back pain and muscle weakness Neurological: negative for dizziness, headaches, vertigo and weakness Endocrine: negative for diabetic symptoms including polydipsia, polyuria and skin dryness Allergic/Immunologic: negative for hay fever and urticaria      Objective:  Blood pressure (!) 145/98, pulse 80, resp. rate 16, height 5' 3.5" (1.613 m), weight 146 lb 3.2 oz (66.3 kg).  Body mass  index is 25.49 kg/m.    General Appearance:    Alert, cooperative, no distress, appears stated age  Head:    Normocephalic, without obvious abnormality, atraumatic  Eyes:    PERRL, conjunctiva/corneas clear, EOM's intact, both eyes  Ears:    Normal external ear canals, both ears  Nose:   Nares normal, septum midline, mucosa normal, no drainage or sinus tenderness  Throat:   Lips, mucosa, and tongue normal; teeth and gums normal  Neck:   Supple, symmetrical, trachea midline, no adenopathy; thyroid: no enlargement/tenderness/nodules; no carotid bruit or JVD  Back:     Symmetric, no curvature, ROM normal, no CVA tenderness  Lungs:     Clear to auscultation bilaterally, respirations unlabored  Chest Wall:    No tenderness or deformity   Heart:    Regular rate and rhythm, S1 and S2 normal, no murmur, rub or gallop  Breast Exam:    No tenderness, masses, or nipple abnormality  Abdomen:  Soft, non-tender, bowel sounds active all four quadrants, no organomegaly.  Mobile mass palpable 2-3 cm above umbilicus.   Genitalia:    Pelvic:external genitalia normal, vagina without lesions, discharge, or tenderness, rectovaginal septum  normal. Cervix normal in appearance, no cervical motion tenderness, no adnexal masses or tenderness.  Uterus enlarged, 14-16 week size, globular shape, mobile, regular contours, nontender.  Rectal:    Normal external sphincter.  No hemorrhoids appreciated. Internal exam not done.   Extremities:   Extremities normal, atraumatic, no cyanosis or edema  Pulses:   2+ and symmetric all extremities  Skin:   Skin color, texture, turgor normal, no rashes or lesions  Lymph nodes:   Cervical, supraclavicular, and axillary nodes normal  Neurologic:   CNII-XII intact, normal strength, sensation and reflexes throughout    Labs:  Reviewed in Care Everywhere  Assessment:   1. Encounter for annual routine gynecological examination   2. Establishing care with new doctor, encounter for    3. Fibroid   4. Encounter for surveillance of contraceptive pills   5. Pelvic pressure in female   6. Dyspareunia in female      Plan:  - Blood tests: Has labs performed by PCP. . - Breast self exam technique reviewed and patient encouraged to perform self-exam monthly. - Contraception: oral progesterone-only contraceptive. Having some spotting, unsure if due to medication or to fibroids.  - Discussed healthy lifestyle modifications. - Mammogram  Up to date. Due at the end of the month. - Pap smear  Up to date. Continue q 5 year screenings.  - COVID vaccination status: has completed series and received booster.  - Fibroid uterus, will order ultrasound. Briefly discussed management options including continued contraception, surgical management with UFE or hysterectomy. Patient notes potential interest in UFE, will give handout.  - Follow up in 1 year for annual exam   Rubie Maid, MD [facility]

## 2021-09-20 DIAGNOSIS — D219 Benign neoplasm of connective and other soft tissue, unspecified: Secondary | ICD-10-CM

## 2021-09-20 DIAGNOSIS — R102 Pelvic and perineal pain: Secondary | ICD-10-CM

## 2021-10-03 ENCOUNTER — Other Ambulatory Visit: Payer: Self-pay

## 2021-10-03 ENCOUNTER — Telehealth: Payer: Self-pay | Admitting: Obstetrics and Gynecology

## 2021-10-03 ENCOUNTER — Ambulatory Visit (INDEPENDENT_AMBULATORY_CARE_PROVIDER_SITE_OTHER): Payer: BC Managed Care – PPO

## 2021-10-03 DIAGNOSIS — R102 Pelvic and perineal pain: Secondary | ICD-10-CM | POA: Diagnosis not present

## 2021-10-03 DIAGNOSIS — Z3041 Encounter for surveillance of contraceptive pills: Secondary | ICD-10-CM

## 2021-10-03 DIAGNOSIS — D219 Benign neoplasm of connective and other soft tissue, unspecified: Secondary | ICD-10-CM

## 2021-10-03 MED ORDER — NORETHINDRONE 0.35 MG PO TABS: 1.0000 | ORAL_TABLET | Freq: Every day | ORAL | 3 refills | Status: DC

## 2021-10-03 NOTE — Telephone Encounter (Signed)
Pt is aware that I have received her telephone request and has refilled the medication [facility] and [facility]

## 2021-10-03 NOTE — Telephone Encounter (Signed)
Ann Morrow called in and states she would like Ann Morrow to take over her prescription for Norethindrone [employer]) .35mg .  Ann Morrow would like it sent [facility] for 90 day supply.  I scanned in her [facility] card in her documents.  Ann Morrow would like a phone call when this has been sent in.  Please advise.

## 2021-10-11 ENCOUNTER — Other Ambulatory Visit: Payer: Self-pay | Admitting: Obstetrics and Gynecology

## 2021-10-11 DIAGNOSIS — Z1231 Encounter for screening mammogram for malignant neoplasm of breast: Secondary | ICD-10-CM

## 2021-10-13 ENCOUNTER — Ambulatory Visit: Payer: BC Managed Care – PPO | Attending: Internal Medicine

## 2021-10-13 ENCOUNTER — Other Ambulatory Visit: Payer: Self-pay

## 2021-10-13 DIAGNOSIS — Z23 Encounter for immunization: Secondary | ICD-10-CM

## 2021-10-13 MED ORDER — PFIZER COVID-19 VAC BIVALENT 30 MCG/0.3ML IM SUSP
INTRAMUSCULAR | 0 refills | Status: DC
  Filled 2021-10-13: qty 0.3, 1d supply, fill #0

## 2021-10-13 NOTE — Addendum Note (Signed)
Addended by: Augusto Gamble on: 10/13/2021 02:04 PM   Modules accepted: Orders

## 2021-10-13 NOTE — Progress Notes (Signed)
   Covid-19 Vaccination Clinic  Name:  Ann Morrow    MRN: [id number] DOB: [birth year]  10/13/2021  Ann Morrow was observed post Covid-19 immunization for 15 minutes without incident. She was provided with Vaccine Information Sheet and instruction to access the V-Safe system.   Ann Morrow was instructed to call 911 with any severe reactions post vaccine: Difficulty breathing  Swelling of face and throat  A fast heartbeat  A bad rash all over body  Dizziness and weakness   Lu Duffel, PharmD, MBA Clinical Acute Care Pharmacist

## 2021-10-17 ENCOUNTER — Ambulatory Visit: Payer: BC Managed Care – PPO

## 2021-10-18 ENCOUNTER — Encounter: Payer: BLUE CROSS/BLUE SHIELD | Admitting: Obstetrics and Gynecology

## 2021-10-19 NOTE — Telephone Encounter (Signed)
Will someone please check on the status of this patient's referral. Thanks Luana Shu

## 2021-10-20 NOTE — Telephone Encounter (Signed)
Please advise. Thanks [name] 

## 2021-10-24 NOTE — Telephone Encounter (Signed)
Please advise. Thanks Keimari Nenzel 

## 2021-10-25 ENCOUNTER — Other Ambulatory Visit: Payer: Self-pay | Admitting: Obstetrics and Gynecology

## 2021-10-25 DIAGNOSIS — D259 Leiomyoma of uterus, unspecified: Secondary | ICD-10-CM

## 2021-10-31 ENCOUNTER — Encounter: Payer: Self-pay | Admitting: *Deleted

## 2021-10-31 ENCOUNTER — Ambulatory Visit
Admission: RE | Admit: 2021-10-31 | Discharge: 2021-10-31 | Disposition: A | Discharge status: Home / Self Care | Payer: BC Managed Care – PPO | Source: Ambulatory Visit | Attending: Obstetrics and Gynecology | Admitting: Obstetrics and Gynecology

## 2021-10-31 DIAGNOSIS — D259 Leiomyoma of uterus, unspecified: Secondary | ICD-10-CM | POA: Diagnosis not present

## 2021-10-31 HISTORY — PX: IR RADIOLOGIST EVAL & MGMT: IMG5224

## 2021-10-31 NOTE — Consult Note (Signed)
Chief Complaint: Patient was seen in consultation today for No chief complaint on file.  at the request of [facility]  Referring Physician(s): Cherry,Anika  History of Present Illness: Ann Morrow is a 50 y.o. female presenting today as a scheduled consultation for symptomatic uterine fibroids.  She complains of 2 of 3 categories of symptoms of fibroids, bulk and pain.  She has had pressure, abdominal and pain, and frequent urination for years. Her symptoms have been worsening recently.  She has been in menopause for approximately 1.5 years.  She denies any vaginal bleeding.  Past Medical History:  Diagnosis Date   Allergy    Asthma    Elevated BP without diagnosis of hypertension    Fibroid    History of shingles    History of vertigo     Past Surgical History:  Procedure Laterality Date   HERNIA REPAIR     IR RADIOLOGIST EVAL & MGMT  10/31/2021   TONSILLECTOMY AND ADENOIDECTOMY      Allergies: Allegra [fexofenadine], Latex, and Loratadine  Medications: Prior to Admission medications   Medication Sig Start Date End Date Taking? Authorizing Provider  cetirizine (ZYRTEC) 10 MG tablet Take by mouth.    [provider]  COVID-19 mRNA bivalent vaccine, Pfizer, (PFIZER COVID-19 VAC BIVALENT) injection Inject into the muscle. 10/13/21   Carlyle Basques, MD  Multiple Vitamin (MULTI-VITAMIN) tablet Take 1 tablet by mouth daily.    [provider]  norethindrone (MICRONOR) 0.35 MG tablet Take 1 tablet (0.35 mg total) by mouth daily. 10/03/21   Rubie Maid, MD     Family History  Problem Relation Age of Onset   Hypertension Mother    Glaucoma Mother    Diabetes Father    Breast cancer Maternal Grandmother 35   Renal cancer Maternal Grandfather    Liver cancer Paternal Grandmother    Cancer Paternal Grandfather    Healthy Brother    Healthy Brother    Breast cancer Cousin 49       paternal side    Social History   Socioeconomic  History   Marital status: Married    Spouse name: Not on file   Number of children: Not on file   Years of education: Not on file   Highest education level: Not on file  Occupational History   Occupation: Banking  Tobacco Use   Smoking status: Never   Smokeless tobacco: Current  Vaping Use   Vaping Use: Never used  Substance and Sexual Activity   Alcohol use: Yes    Alcohol/week: 1.0 standard drink    Types: 1 Glasses of wine per week   Drug use: No   Sexual activity: Yes    Birth control/protection: Pill  Other Topics Concern   Not on file  Social History Narrative   Patient is Jehovah's Witness who refuses whole blood product transfusion.  In mutually monogamous relationship and married for 17 years (as of 12/11/17).   Social Determinants of Health   Financial Resource Strain: Not on file  Food Insecurity: Not on file  Transportation Needs: Not on file  Physical Activity: Not on file  Stress: Not on file  Social Connections: Not on file   Review of Systems: A 12 point ROS discussed and pertinent positives are indicated in the HPI above.  All other systems are negative.  Review of Systems  Vital Signs: There were no vitals taken for this visit.  Physical Exam Constitutional:      Appearance: Normal  appearance. She is normal weight.  Cardiovascular:     Rate and Rhythm: Normal rate and regular rhythm.     Pulses: Normal pulses.     Heart sounds: Normal heart sounds.  Pulmonary:     Effort: Pulmonary effort is normal.     Breath sounds: Normal breath sounds.  Abdominal:     General: Bowel sounds are normal.     Palpations: Abdomen is soft.  Skin:    General: Skin is warm and dry.  Neurological:     Mental Status: She is alert and oriented to person, place, and time.  Psychiatric:        Mood and Affect: Mood normal.        Behavior: Behavior normal.    Imaging: US PELVIS (TRANSABDOMINAL ONLY)  Result Date: 10/07/2021 Patient Name: Ann Morrow DOB:  1971-12-02 MRN: [id number] ULTRASOUND REPORT Location: [facility] Date of Service: 10/03/2021 Indications:Enlarged Uterus ; Remote hx Fibroids Findings: The uterus is anteverted and measures 16 x 7 x 8.3 cm. Echo texture is heterogenous with evidence of focal masses. Within the uterus are innumerable suspected fibroids, with largest measuring 4.4 x 4.3 x 4.0 cm, in the right fundal region. The Endometrium is obscured and measurement could not be obtained. Ovaries are not visualized. Survey of the adnexa demonstrates no adnexal masses. There is no free fluid in the cul de sac. Impression: 1. Enlarged, Fibroid uterus 2. Endometrium is obscured and can not be evaluated; suspect submucosal fibroid(s). Recommendations: 1.Clinical correlation with the patient's History and Physical Exam. Gaspar Cola I have reviewed this study and agree with documented findings. Rubie Maid, MD [facility]  IR Radiologist Eval & Mgmt  Result Date: 10/31/2021 Please refer to notes tab for details about interventional procedure. (Op Note)   Labs:  CBC: No results for input(s): WBC, HGB, HCT, PLT in the last 8760 hours.  COAGS: No results for input(s): INR, APTT in the last 8760 hours.  BMP: No results for input(s): NA, K, CL, CO2, GLUCOSE, BUN, CALCIUM, CREATININE, GFRNONAA, GFRAA in the last 8760 hours.  Invalid input(s): CMP  LIVER FUNCTION TESTS: No results for input(s): BILITOT, AST, ALT, ALKPHOS, PROT, ALBUMIN in the last 8760 hours.  TUMOR MARKERS: No results for input(s): AFPTM, CEA, CA199, CHROMGRNA in the last 8760 hours.  Assessment and Plan: Ann Morrow is a 50 year old female presenting to IR clinic for discussion of symptomatic uterine fibroids.  Her primary symptoms are pain and bulk.  I had a lengthy conversation with her regarding the anatomy, pathology, and treatment options for fibroids.  She was counseled on options for treatment of uterine fibroids including doing  nothing, hormonal therapy, myomectomy, hysterectomy, and uterine artery embolization.  Regarding UFE, we had a discussion the efficacy, expectations regarding outcomes/long term efficacy, and risk/benefit.    I shared with her a summary of the literature for UFE efficacy, which generally is accepted to be adequate symptom relief with good restoration of quality of life at 1 year in 90% or greater of patients for bleeding > pain > bulk symptoms.  I also discussed with her that there is a cited rate of retreatment in ~15-20% of patients in the long term, with overall excellent long term utility of symptom relief and QOL.    We discussed the expectations not just for the treatment day and post treatment admission to 23 hour observation, but the first 3 months, 6 months - 12 months, and our follow up.  I recommended that she take 7-10 days off of work to allow her body adequate recovery time.   Regarding risks, specific risks discussed include: post-embolization syndrome, bleeding, infection, contrast reaction, kidney/artery injury, need for further surgery/procedure, including hysterectomy, need for hospitalization, cardiopulmonary collapse, death.    Regarding post-embolization syndrome, I did let her know that this is essentially expected, with a typical prodromal syndrome lasting 4-7 days typically, and usually treated with medication/support such as hydration, rest, analgesics, PO nausea medications, and stool softeners.   We also discussed the need for MRI and possibility that she may be excluded by findings, or possibly requiring a follow up conversation if we feel that Adenomyosis is present/contributing.  After discussing, she would like to consider this option.  She will contact us if she wishes to move forward.   Plan:  If she would like to move forward with UFE - Obtain MRI to evaluate uterus/fibroids - Tentatively to proceed with uterine artery embolization at first available, with Dr.  Dwaine Gale   Thank you for this interesting consult.  I greatly enjoyed meeting Ann Morrow and look forward to participating in their care.  A copy of this report was sent to the requesting provider on this date.  Electronically Signed: Paula Libra Jebidiah Morrow 10/31/2021, 4:14 PM   I spent a total of  30 Minutes   in face to face in clinical consultation, greater than 50% of which was counseling/coordinating care for Uterine Fibroids.

## 2021-12-19 ENCOUNTER — Other Ambulatory Visit: Payer: Self-pay

## 2021-12-19 ENCOUNTER — Ambulatory Visit
Admission: RE | Admit: 2021-12-19 | Discharge: 2021-12-19 | Disposition: A | Discharge status: Home / Self Care | Payer: BC Managed Care – PPO | Source: Ambulatory Visit | Attending: Obstetrics and Gynecology | Admitting: Obstetrics and Gynecology

## 2021-12-19 DIAGNOSIS — Z1231 Encounter for screening mammogram for malignant neoplasm of breast: Secondary | ICD-10-CM | POA: Diagnosis not present

## 2022-01-23 ENCOUNTER — Encounter: Payer: Self-pay | Admitting: Obstetrics and Gynecology

## 2022-01-23 DIAGNOSIS — Z3041 Encounter for surveillance of contraceptive pills: Secondary | ICD-10-CM

## 2022-01-23 MED ORDER — NORETHINDRONE 0.35 MG PO TABS: 1.0000 | ORAL_TABLET | Freq: Every day | ORAL | 3 refills | Status: DC

## 2022-01-23 NOTE — Telephone Encounter (Signed)
Sent in Micronor prescription.

## 2022-01-29 ENCOUNTER — Other Ambulatory Visit: Payer: Self-pay

## 2022-01-29 DIAGNOSIS — Z3041 Encounter for surveillance of contraceptive pills: Secondary | ICD-10-CM

## 2022-01-29 MED ORDER — NORETHINDRONE 0.35 MG PO TABS: 1.0000 | ORAL_TABLET | Freq: Every day | ORAL | 3 refills | Status: DC

## 2022-05-21 DIAGNOSIS — H1131 Conjunctival hemorrhage, right eye: Secondary | ICD-10-CM | POA: Diagnosis not present

## 2022-07-30 NOTE — Progress Notes (Unsigned)
GYNECOLOGY PROGRESS NOTE  Subjective:    Patient ID: Ann Morrow, female    DOB: [birth year], 51 y.o.   MRN: [id number]  HPI  Patient is a 51 y.o. G0P0000 female who presents for further discussion of management of fibroids. She had a pelvic ultrasound on 10/21/2021. She continues to have pain and pressure that comes and goes. She has some frequent urination, intercourse is uncomfortable and she feels bloated and heavy. No LMP recorded. (Menstrual status: Oral contraceptives). She was previously referred for UFE, however after evaluation they informed that since her main symptom was not bleeding, she may receive as much benefit from the procedures.  Desires to discuss other options again.    The following portions of the patient's history were reviewed and updated as appropriate: allergies, current medications, past family history, past medical history, past social history, past surgical history, and problem list.  Review of Systems Pertinent items noted in HPI and remainder of comprehensive ROS otherwise negative.   Objective:   Blood pressure (!) 142/77, pulse 79, resp. rate 16, height 5' 3.5" (1.613 m), weight 144 lb 9.6 oz (65.6 kg). Body mass index is 25.21 kg/m. General appearance: alert, cooperative, and no distress Abdomen: normal findings: soft, non-tender and bowel sounds present; and abnormal findings:  mass, located in the lower abdomen (arising from the pelvis, palpable ~ 3-4 cm below umbilicus) Pelvic: deferred Extremities: extremities normal, atraumatic, no cyanosis or edema Neurologic: Grossly normal   Labs:  Results for orders placed or performed in visit on 07/31/22  POCT Urinalysis Dipstick  Result Value Ref Range   Color, UA     Clarity, UA     Glucose, UA Negative Negative   Bilirubin, UA Negative    Ketones, UA Negative    Spec Grav, UA 1.010 1.010 - 1.025   Blood, UA Moderate    pH, UA 6.5 5.0 - 8.0   Protein, UA Negative Negative   Urobilinogen,  UA 0.2 0.2 or 1.0 E.U./dL   Nitrite, UA Negative    Leukocytes, UA Negative Negative   Appearance     Odor      Imaging:  Patient Name: Ann Morrow DOB: 10-16-1971 MRN: [id number]   ULTRASOUND REPORT   Location: [facility]  Date of Service: 10/03/2021        Indications:Enlarged Uterus ; Remote hx Fibroids Findings:  The uterus is anteverted and measures 16 x 7 x 8.3 cm. Echo texture is heterogenous with evidence of focal masses. Within the uterus are innumerable suspected fibroids, with largest measuring 4.4 x 4.3 x 4.0 cm, in the right fundal region.   The Endometrium is obscured and measurement could not be obtained.   Ovaries are not visualized. Survey of the adnexa demonstrates no adnexal masses. There is no free fluid in the cul de sac.   Impression: 1. Enlarged, Fibroid uterus 2. Endometrium is obscured and can not be evaluated; suspect submucosal fibroid(s).   Recommendations: 1.Clinical correlation with the patient's History and Physical Exam.   Gaspar Cola     I have reviewed this study and agree with documented findings.      Rubie Maid, MD [facility]  Assessment:   1. Leiomyoma   2. Pelvic pressure in female   3. Urinary frequency      Plan:   1. Leiomyoma - Lengthy discussion had again regarding potential management options for her uterine fibroids and pressure symptoms. Patient continues to decline definitive management with hysterectomy  at this time. Discussed use of medications (GnRH agonist, Depo Provera), or surgical intervention with fibroid radiofrequency ablation ([facility] system). Discussed risks and benefits in detail of all options. Patient notes interest in the [place] system. Given info.  Ultrasound not definitive in location/size of all fibroids. Would recommend MRI if surgical intervention is to be undertaken. All questions answered. To notify MD once decision was made.   2. Pelvic pressure in  female - See above discussion for management of fibroids.   3. Urinary frequency - POCT Urinalysis Dipstick; negative.  Likely secondary to pelvic pressure.    A total of 55 minutes were spent during this encounter, including review of previous progress notes, recent imaging and labs, face-to-face with time with patient involving counseling and coordination of care, as well as documentation for current visit.   Rubie Maid, MD [facility]

## 2022-07-31 ENCOUNTER — Ambulatory Visit (INDEPENDENT_AMBULATORY_CARE_PROVIDER_SITE_OTHER): Payer: BC Managed Care – PPO | Admitting: Obstetrics and Gynecology

## 2022-07-31 ENCOUNTER — Encounter: Payer: Self-pay | Admitting: Obstetrics and Gynecology

## 2022-07-31 VITALS — BP 142/77 | HR 79 | Resp 16 | Ht 63.5 in | Wt 144.6 lb

## 2022-07-31 DIAGNOSIS — R102 Pelvic and perineal pain: Secondary | ICD-10-CM

## 2022-07-31 DIAGNOSIS — R350 Frequency of micturition: Secondary | ICD-10-CM | POA: Diagnosis not present

## 2022-07-31 DIAGNOSIS — D219 Benign neoplasm of connective and other soft tissue, unspecified: Secondary | ICD-10-CM

## 2022-07-31 LAB — POCT URINALYSIS DIPSTICK
Bilirubin, UA: NEGATIVE
Glucose, UA: NEGATIVE
Ketones, UA: NEGATIVE
Leukocytes, UA: NEGATIVE
Nitrite, UA: NEGATIVE
Protein, UA: NEGATIVE
Spec Grav, UA: 1.01 (ref 1.010–1.025)
Urobilinogen, UA: 0.2 E.U./dL
pH, UA: 6.5 (ref 5.0–8.0)

## 2022-09-06 DIAGNOSIS — R232 Flushing: Secondary | ICD-10-CM | POA: Diagnosis not present

## 2022-09-06 DIAGNOSIS — D259 Leiomyoma of uterus, unspecified: Secondary | ICD-10-CM | POA: Diagnosis not present

## 2022-09-06 DIAGNOSIS — N951 Menopausal and female climacteric states: Secondary | ICD-10-CM | POA: Diagnosis not present

## 2022-09-19 DIAGNOSIS — R509 Fever, unspecified: Secondary | ICD-10-CM | POA: Diagnosis not present

## 2022-09-19 DIAGNOSIS — U071 COVID-19: Secondary | ICD-10-CM | POA: Diagnosis not present

## 2022-11-26 ENCOUNTER — Other Ambulatory Visit: Payer: Self-pay | Admitting: Obstetrics and Gynecology

## 2022-11-26 DIAGNOSIS — Z1231 Encounter for screening mammogram for malignant neoplasm of breast: Secondary | ICD-10-CM

## 2022-12-07 ENCOUNTER — Other Ambulatory Visit: Payer: Self-pay | Admitting: Obstetrics and Gynecology

## 2022-12-07 NOTE — Telephone Encounter (Signed)
Called pt to scheduled annual appt with Dr. Marcelline Mates.  Left message for pt to call back.  Also sent a message via [place].

## 2022-12-10 NOTE — Telephone Encounter (Signed)
Pt is scheduled with Dr. Marcelline Mates on 2/7 at 9:00. She can get her refills now, correct?

## 2022-12-20 ENCOUNTER — Ambulatory Visit
Admission: RE | Admit: 2022-12-20 | Discharge: 2022-12-20 | Disposition: A | Discharge status: Home / Self Care | Payer: BC Managed Care – PPO | Source: Ambulatory Visit | Attending: Obstetrics and Gynecology | Admitting: Obstetrics and Gynecology

## 2022-12-20 DIAGNOSIS — Z1231 Encounter for screening mammogram for malignant neoplasm of breast: Secondary | ICD-10-CM | POA: Insufficient documentation

## 2023-02-05 ENCOUNTER — Ambulatory Visit (INDEPENDENT_AMBULATORY_CARE_PROVIDER_SITE_OTHER): Payer: BC Managed Care – PPO | Admitting: Obstetrics & Gynecology

## 2023-02-05 ENCOUNTER — Encounter: Payer: Self-pay | Admitting: Obstetrics & Gynecology

## 2023-02-05 ENCOUNTER — Other Ambulatory Visit (HOSPITAL_COMMUNITY)
Admission: RE | Admit: 2023-02-05 | Discharge: 2023-02-05 | Disposition: A | Discharge status: Home / Self Care | Payer: BC Managed Care – PPO | Source: Ambulatory Visit | Attending: Obstetrics and Gynecology | Admitting: Obstetrics and Gynecology

## 2023-02-05 VITALS — BP 131/82 | HR 76 | Resp 15 | Ht 63.5 in | Wt 144.8 lb

## 2023-02-05 DIAGNOSIS — N912 Amenorrhea, unspecified: Secondary | ICD-10-CM

## 2023-02-05 DIAGNOSIS — Z124 Encounter for screening for malignant neoplasm of cervix: Secondary | ICD-10-CM | POA: Insufficient documentation

## 2023-02-05 DIAGNOSIS — N951 Menopausal and female climacteric states: Secondary | ICD-10-CM | POA: Diagnosis not present

## 2023-02-05 DIAGNOSIS — Z01419 Encounter for gynecological examination (general) (routine) without abnormal findings: Secondary | ICD-10-CM | POA: Insufficient documentation

## 2023-02-05 NOTE — Progress Notes (Signed)
Subjective:    Ann Morrow is a 52 y.o. married P0  who presents for an annual exam. The patient has no complaints today. She has not had a period for 3 years, on POPs. She has 2 year h/o hot flashes, night sweats, and some vaginal dryness.  The patient is sexually active. GYN screening history: last pap: was normal. The patient wears seatbelts: yes. The patient participates in regular exercise: yes. (Walking) Has the patient ever been transfused or tattooed?: no. The patient reports that there is not domestic violence in her life.   Menstrual History: OB History     Gravida  0   Para  0   Term  0   Preterm  0   AB  0   Living  0      SAB  0   IAB  0   Ectopic  0   Multiple  0   Live Births  0            No LMP recorded. (Menstrual status: Oral contraceptives).    The following portions of the patient's history were reviewed and updated as appropriate: allergies, current medications, past family history, past medical history, past social history, past surgical history, and problem list.  Review of Systems Pertinent items are noted in HPI.  Married for 23 years, uses lubricant prn Mammogram 11/2022 FH-  breast cancer in maternal GM, paternal first cousin Ovarian cancer in paternal first cousin    Objective:    BP 131/82   Pulse 76   Resp 15   Ht 5' 3.5" (1.613 m)   Wt 144 lb 12.8 oz (65.7 kg)   BMI 25.25 kg/m   General Appearance:    Alert, cooperative, no distress, appears stated age  Head:    Normocephalic, without obvious abnormality, atraumatic  Eyes:    PERRL, conjunctiva/corneas clear, EOM's intact, fundi    benign, both eyes  Ears:    Normal TM's and external ear canals, both ears  Nose:   Nares normal, septum midline, mucosa normal, no drainage    or sinus tenderness  Throat:   Lips, mucosa, and tongue normal; teeth and gums normal  Neck:   Supple, symmetrical, trachea midline, no adenopathy;    thyroid:  no  enlargement/tenderness/nodules; no carotid   bruit or JVD  Back:     Symmetric, no curvature, ROM normal, no CVA tenderness  Lungs:     Clear to auscultation bilaterally, respirations unlabored  Chest Wall:    No tenderness or deformity   Heart:    Regular rate and rhythm, S1 and S2 normal, no murmur, rub   or gallop  Breast Exam:    No tenderness, masses, or nipple abnormality  Abdomen:     Soft, non-tender, bowel sounds active all four quadrants,    no masses, no organomegaly  Genitalia:    Normal female without lesion, discharge or tenderness, moderate VVA, cervix stenotic, posterior 16 week size lumpy uterus c/w fibroids, no adnexal masses or tenderness     Extremities:   Extremities normal, atraumatic, no cyanosis or edema  Pulses:   2+ and symmetric all extremities  Skin:   Skin color, texture, turgor normal, no rashes or lesions  Lymph nodes:   Cervical, supraclavicular, and axillary nodes normal  Neurologic:   CNII-XII intact, normal strength, sensation and reflexes    throughout  .    Assessment:    Healthy female exam.   Menopausal symptoms- check [place] If  [place] in the menopausal range, she can stop the POPs  Plan:     Thin prep Pap smear. With HPV cotesting

## 2023-02-06 ENCOUNTER — Ambulatory Visit: Payer: BC Managed Care – PPO | Admitting: Obstetrics and Gynecology

## 2023-02-06 LAB — FOLLICLE STIMULATING HORMONE: FSH: 114 m[IU]/mL

## 2023-02-07 LAB — CYTOLOGY - PAP
Comment: NEGATIVE
Diagnosis: NEGATIVE
High risk HPV: NEGATIVE

## 2023-02-11 ENCOUNTER — Encounter: Payer: Self-pay | Admitting: Obstetrics & Gynecology

## 2023-05-08 DIAGNOSIS — Z130 Encounter for screening for diseases of the blood and blood-forming organs and certain disorders involving the immune mechanism: Secondary | ICD-10-CM | POA: Diagnosis not present

## 2023-05-08 DIAGNOSIS — Z13220 Encounter for screening for lipoid disorders: Secondary | ICD-10-CM | POA: Diagnosis not present

## 2023-05-08 DIAGNOSIS — Z1329 Encounter for screening for other suspected endocrine disorder: Secondary | ICD-10-CM | POA: Diagnosis not present

## 2023-05-08 DIAGNOSIS — R7303 Prediabetes: Secondary | ICD-10-CM | POA: Diagnosis not present

## 2023-07-24 IMAGING — MG MM DIGITAL SCREENING BILAT W/ TOMO AND CAD
8 series · 8 of 24 positions shown · non-contrast
Comparison: Previous exam(s).

CLINICAL DATA: Screening.

EXAM:
DIGITAL SCREENING BILATERAL MAMMOGRAM WITH TOMOSYNTHESIS AND CAD
TECHNIQUE: Bilateral screening digital craniocaudal and mediolateral oblique
mammograms were obtained. Bilateral screening digital breast
tomosynthesis was performed. The images were evaluated with
computer-aided detection.

[R CC synth-2D]
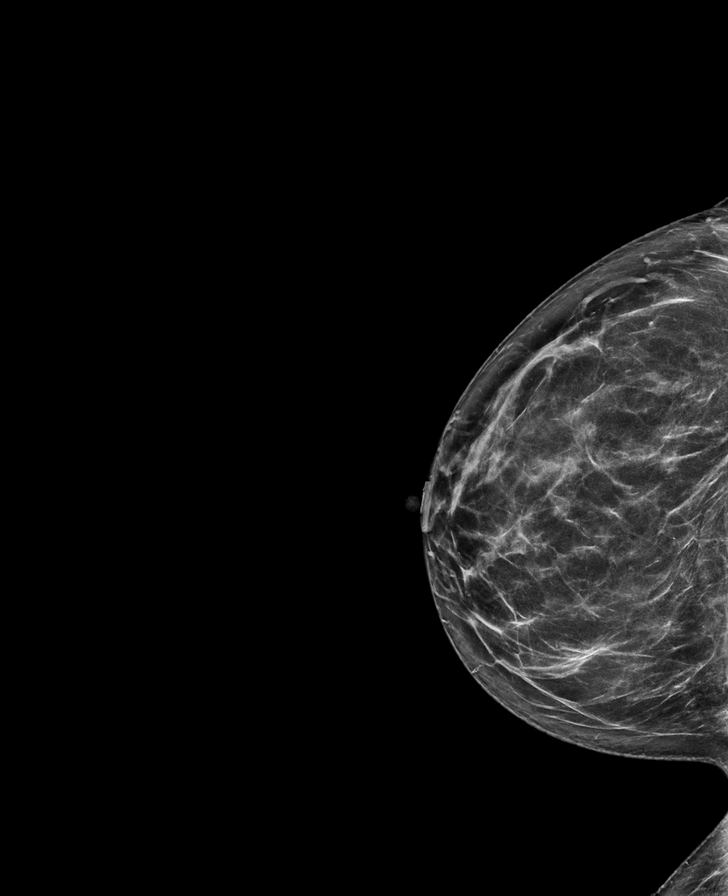

[R MLO synth-2D]
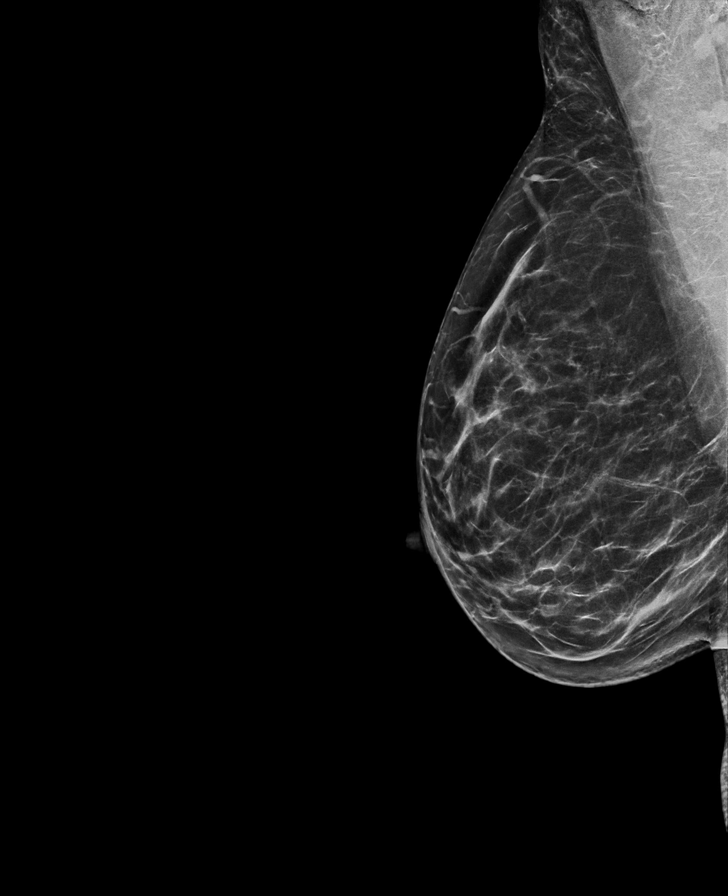

[L CC synth-2D]
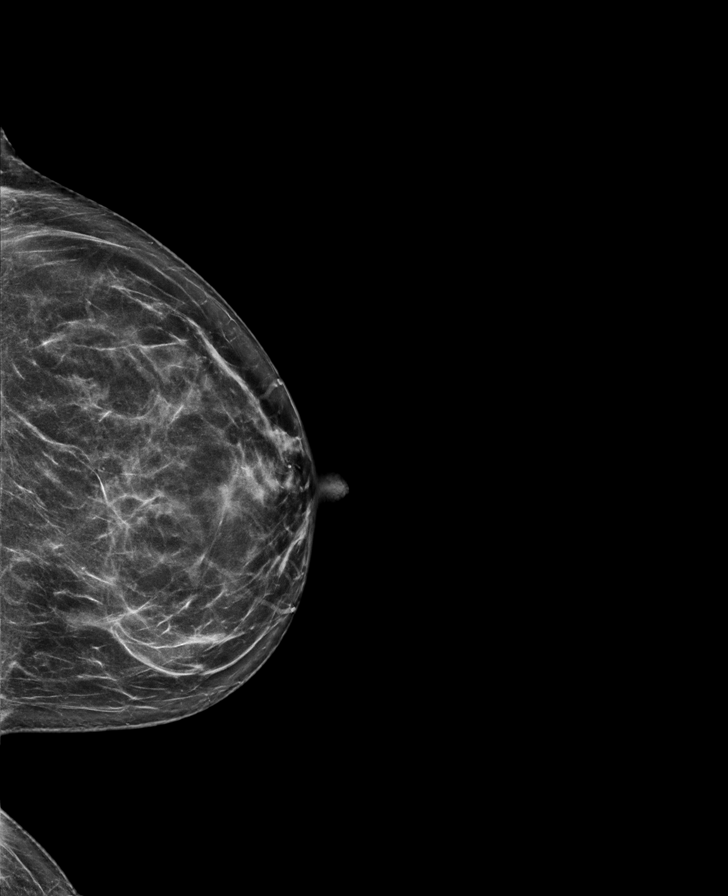

[L MLO synth-2D]
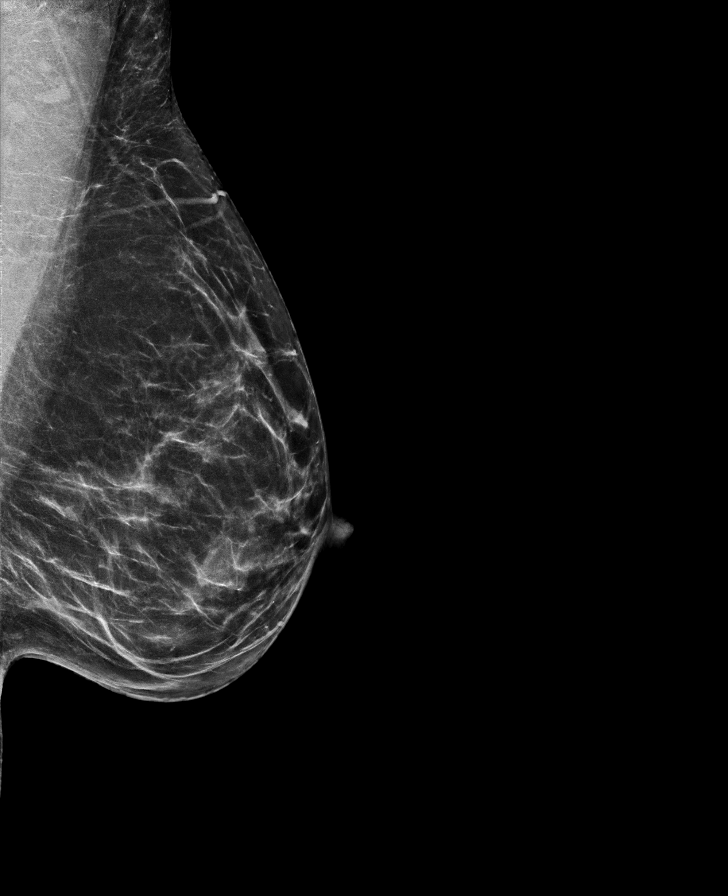

[L MLO tomo · tomo slice 34/67.0]
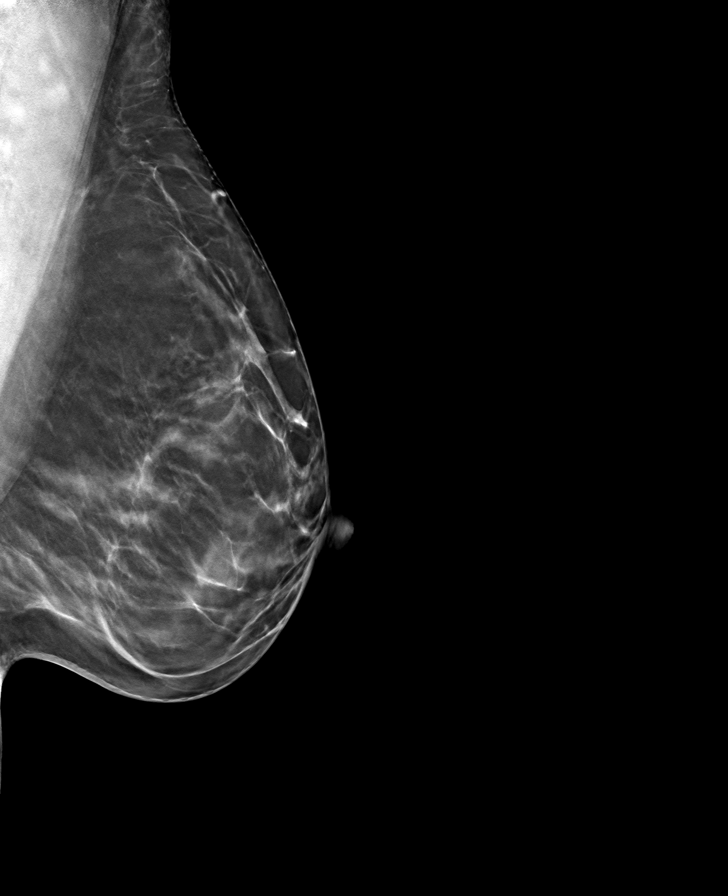

[L CC tomo · tomo slice 35/68.0]
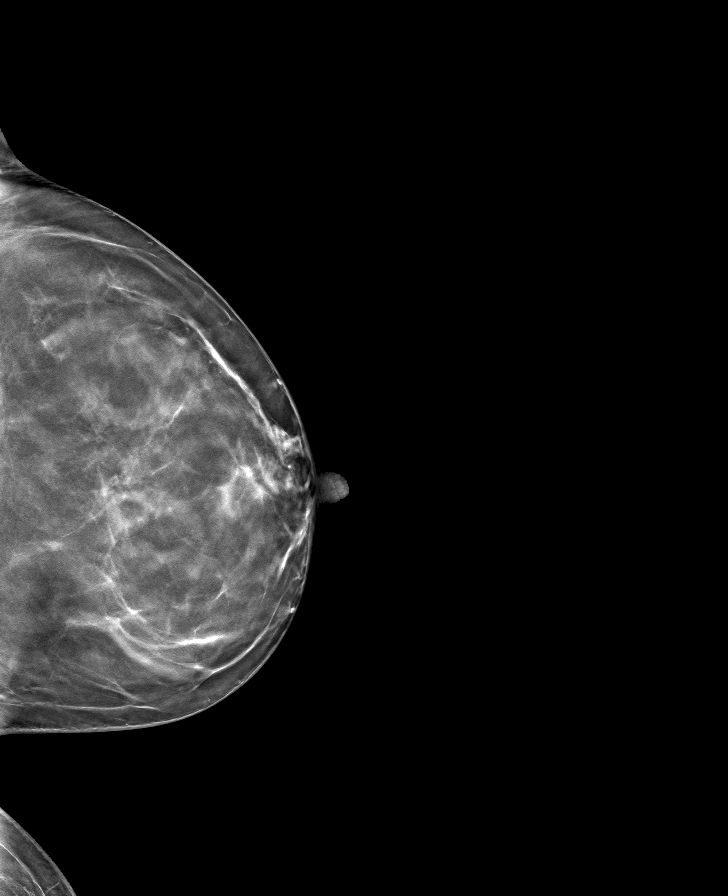

[R CC tomo · tomo slice 35/69.0]
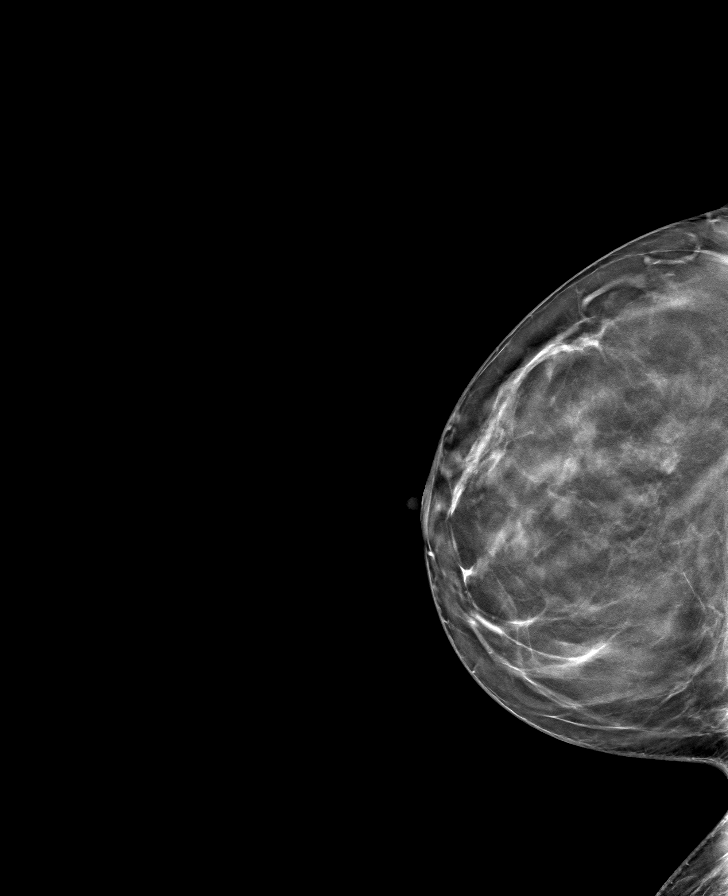

[R MLO tomo · tomo slice 34/67.0]
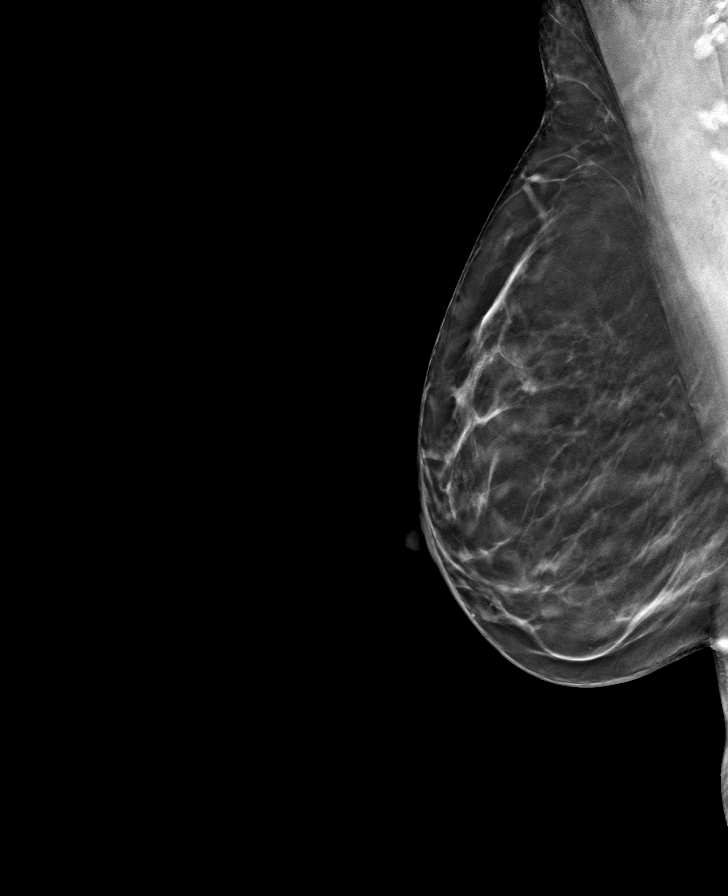

[8 of 24 positions shown; findings below may reference images not displayed]

ACR Breast Density Category b: There are scattered areas of
fibroglandular density.
FINDINGS: There are no findings suspicious for malignancy.
IMPRESSION: No mammographic evidence of malignancy. A result letter of this
screening mammogram will be mailed directly to the patient.

RECOMMENDATION:
Screening mammogram in one year. (Code:51-O-LD2)

BI-RADS CATEGORY  1: Negative.

## 2023-12-04 ENCOUNTER — Other Ambulatory Visit: Payer: Self-pay | Admitting: Nurse Practitioner

## 2023-12-04 DIAGNOSIS — Z1231 Encounter for screening mammogram for malignant neoplasm of breast: Secondary | ICD-10-CM

## 2023-12-31 ENCOUNTER — Ambulatory Visit
Admission: RE | Admit: 2023-12-31 | Discharge: 2023-12-31 | Disposition: A | Discharge status: Home / Self Care | Payer: Managed Care, Other (non HMO) | Source: Ambulatory Visit | Attending: Nurse Practitioner | Admitting: Nurse Practitioner

## 2023-12-31 DIAGNOSIS — Z1231 Encounter for screening mammogram for malignant neoplasm of breast: Secondary | ICD-10-CM | POA: Insufficient documentation

## 2024-04-07 ENCOUNTER — Ambulatory Visit: Admitting: Obstetrics & Gynecology

## 2024-04-07 VITALS — BP 140/77 | HR 60 | Wt 150.0 lb

## 2024-04-07 DIAGNOSIS — Z1339 Encounter for screening examination for other mental health and behavioral disorders: Secondary | ICD-10-CM | POA: Diagnosis not present

## 2024-04-07 DIAGNOSIS — Z1211 Encounter for screening for malignant neoplasm of colon: Secondary | ICD-10-CM

## 2024-04-07 DIAGNOSIS — N951 Menopausal and female climacteric states: Secondary | ICD-10-CM | POA: Diagnosis not present

## 2024-04-07 DIAGNOSIS — Z01419 Encounter for gynecological examination (general) (routine) without abnormal findings: Secondary | ICD-10-CM | POA: Diagnosis not present

## 2024-04-07 NOTE — Patient Instructions (Signed)
[email or web address]

## 2024-04-07 NOTE — Progress Notes (Signed)
 GYNECOLOGY ANNUAL PREVENTATIVE CARE ENCOUNTER NOTE  History:     Ann Morrow is a 53 y.o. G0P0000 female here for a routine annual gynecologic exam.  Current complaints: hot flashes and insomnia, wants to discuss management.  Also has known fibroids, wants examination for them but declines pap smear. Has some pain during intercourse due to fibroids, wants myomectomy but has been told hysterectomy would be preferred.  Also had appointments for uterine fibroid embolization, [facility] and was told she was not a candidate given absence of bleeding.    Denies abnormal vaginal bleeding, discharge, other pelvic pain or problems with intercourse or other gynecologic concerns.    Gynecologic History No LMP recorded (lmp unknown). Patient is postmenopausal. Contraception: post menopausal status Last Pap: 02/05/2023. Result was normal with negative HPV Last Mammogram: 12/31/23.  Result was normal Last Cologuard: 06/13/2021.  Result was normal  Obstetric History OB History  Gravida Para Term Preterm AB Living  0 0 0 0 0 0  SAB IAB Ectopic Multiple Live Births  0 0 0 0 0    Past Medical History:  Diagnosis Date   Allergy    Asthma    Elevated BP without diagnosis of hypertension    Fibroid    History of shingles    History of vertigo     Past Surgical History:  Procedure Laterality Date   HERNIA REPAIR     IR RADIOLOGIST EVAL & MGMT  10/31/2021   TONSILLECTOMY AND ADENOIDECTOMY      Current Outpatient Medications on File Prior to Visit  Medication Sig Dispense Refill   Ascorbic Acid (VITAMIN C PO) Take by mouth.     cetirizine (ZYRTEC) 10 MG tablet Take by mouth.     Multiple Vitamin (MULTI-VITAMIN) tablet Take 1 tablet by mouth daily.     Multiple Vitamins-Minerals (ZINC PO) Take by mouth.     NORA-BE 0.35 MG tablet TAKE 1 TABLET DAILY 84 tablet 0   No current facility-administered medications on file prior to visit.    Allergies  Allergen Reactions   Albumin Human Other  (See Comments)    Pt is one of Jehovah's Witnesses and refuses all blood products.   Allegra [Fexofenadine]     Jittery    Latex    Loratadine     REACTION: jittery    Social History:  reports that she has never smoked. She uses smokeless tobacco. She reports current alcohol use of about 1.0 standard drink of alcohol per week. She reports that she does not use drugs.  Family History  Problem Relation Age of Onset   Hypertension Mother    Glaucoma Mother    Diabetes Father    Breast cancer Maternal Grandmother 52   Renal cancer Maternal Grandfather    Liver cancer Paternal Grandmother    Cancer Paternal Grandfather    Healthy Brother    Healthy Brother    Breast cancer Cousin 51       paternal side    The following portions of the patient's history were reviewed and updated as appropriate: allergies, current medications, past family history, past medical history, past social history, past surgical history and problem list.  Review of Systems Pertinent items noted in HPI and remainder of comprehensive ROS otherwise negative.  Physical Exam:  BP (!) 140/77   Pulse 60   Wt 150 lb (68 kg)   LMP  (LMP Unknown) Comment: She has not had a cycle in 1.5 years. She had some spotting  x 1 month ago for a few days.  BMI 26.15 kg/m  CONSTITUTIONAL: Well-developed, well-nourished female in no acute distress.  HENT:  Normocephalic, atraumatic, External right and left ear normal.  EYES: Conjunctivae and EOM are normal. Pupils are equal, round, and reactive to light. No scleral icterus.  NECK: Normal range of motion, supple, no masses observed. SKIN: Skin is warm and dry. No rash noted. Not diaphoretic. No erythema. No pallor. MUSCULOSKELETAL: Normal range of motion. No tenderness.  No cyanosis, clubbing, or edema. NEUROLOGIC: Alert and oriented to person, place, and time. Normal muscle tone coordination.  PSYCHIATRIC: Normal mood and affect. Normal behavior. Normal judgment and thought  content. CARDIOVASCULAR: Normal heart rate noted, regular rhythm RESPIRATORY: Clear to auscultation bilaterally. Effort and breath sounds normal, no problems with respiration noted. BREASTS: Symmetric in size. No masses, tenderness, skin changes, nipple drainage, or lymphadenopathy bilaterally. Performed in the presence of a chaperone. ABDOMEN: Soft, no distention noted.  No tenderness, rebound or guarding.  PELVIC: Normal appearing external genitalia and urethral meatus. Speculum exam deferred.  No abnormal vaginal discharge noted.  16 week size fibroid uterus with anterior 4 cm fibroid palpated that is tender to move.  Performed in the presence of a chaperone.   Assessment and Plan:    1. Menopausal symptoms Patient with bothersome menopausal vasomotor symptoms.  Discussed using hormone therapy and concerns about increased risk of heart disease, cerebrovascular disease, thromboembolic disease, and breast cancer. She is not interested in antidepressants or other medical therapies, wants "natural methods".  Also discussed alternative therapies such as herbal remedies but cautioned that most of the products contained phytoestrogens (plant estrogens) in unregulated amounts which can have the same effects on the body as the pharmaceutical estrogen preparations.  Also referred her to [email or web address] for other alternative options. She will continue antihistamines for sleep for now, let us know what she decides to do.   2. Screening for colon cancer Discussed need for colon cancer screening over the age 90. Colonoscopy recommended as this is the gold standard. Referral made to Gastroenterology for colonoscopy - Ambulatory referral to Gastroenterology  3. Well woman exam (Primary) Pap smear and mammogram  are up to date Patient is still unsure about what to do about her fibroid uterus, will follow up with her about this. Routine preventative health maintenance measures emphasized. Please refer to After  Visit Summary for other counseling recommendations.      Jaynie Collins, MD, FACOG Obstetrician & Gynecologist, [facility]ician [facility] for [employer], [facility]

## 2024-04-08 ENCOUNTER — Telehealth: Payer: Self-pay

## 2024-04-08 ENCOUNTER — Other Ambulatory Visit: Payer: Self-pay

## 2024-04-08 DIAGNOSIS — Z1211 Encounter for screening for malignant neoplasm of colon: Secondary | ICD-10-CM

## 2024-04-08 MED ORDER — NA SULFATE-K SULFATE-MG SULF 17.5-3.13-1.6 GM/177ML PO SOLN
1.0000 | Freq: Once | ORAL | 0 refills | Status: AC
Start: 2024-04-08 — End: 2024-04-08

## 2024-04-08 NOTE — Telephone Encounter (Signed)
 Gastroenterology Pre-Procedure Review  Request Date: 04/27/24 Requesting Physician: Dr. Tobi Bastos  PATIENT REVIEW QUESTIONS: The patient responded to the following health history questions as indicated:    1. Are you having any GI issues? no 2. Do you have a personal history of Polyps? no 3. Do you have a family history of Colon Cancer or Polyps? yes (mother and father had colon polyps, paternal grandfather colon cancer) 4. Diabetes Mellitus? no 5. Joint replacements in the past 12 months?no 6. Major health problems in the past 3 months?no 7. Any artificial heart valves, MVP, or defibrillator?no    MEDICATIONS & ALLERGIES:    Patient reports the following regarding taking any anticoagulation/antiplatelet therapy:   Plavix, Coumadin, Eliquis, Xarelto, Lovenox, Pradaxa, Brilinta, or Effient? no Aspirin? no  Patient confirms/reports the following medications:  Current Outpatient Medications  Medication Sig Dispense Refill   Ascorbic Acid (VITAMIN C PO) Take by mouth.     cetirizine (ZYRTEC) 10 MG tablet Take by mouth.     Multiple Vitamin (MULTI-VITAMIN) tablet Take 1 tablet by mouth daily.     Multiple Vitamins-Minerals (ZINC PO) Take by mouth.     NORA-BE 0.35 MG tablet TAKE 1 TABLET DAILY 84 tablet 0   No current facility-administered medications for this visit.    Patient confirms/reports the following allergies:  Allergies  Allergen Reactions   Albumin Human Other (See Comments)    Pt is one of Jehovah's Witnesses and refuses all blood products.   Allegra [Fexofenadine]     Jittery    Latex    Loratadine     REACTION: jittery    No orders of the defined types were placed in this encounter.   AUTHORIZATION INFORMATION Primary Insurance: 1D#: Group #:  Secondary Insurance: 1D#: Group #:  SCHEDULE INFORMATION: Date: 04/27/24 Time: Location: [facility]

## 2024-04-13 ENCOUNTER — Telehealth: Payer: Self-pay

## 2024-04-13 NOTE — Telephone Encounter (Signed)
 Pt returned phone call back to reschedule her 04/27/24 colonoscopy.  Colonoscopy has been rescheduled to 05/12/24.  Trish in Endo notified.  Instructions updated. Referral updated.  Thanks, [place], [place]

## 2024-04-13 NOTE — Telephone Encounter (Signed)
 Returned phone call to reschedule patient's colonoscopy.  She lvm stating she had a schedule conflict.  Colonoscopy currently scheduled with Dr. Antony Baumgartner 03/2824.  Thanks,  [place], [place]

## 2024-04-27 ENCOUNTER — Encounter: Payer: Managed Care, Other (non HMO) | Admitting: Obstetrics & Gynecology

## 2024-05-05 ENCOUNTER — Encounter: Payer: Self-pay | Admitting: Gastroenterology

## 2024-05-11 ENCOUNTER — Encounter: Payer: Self-pay | Admitting: Gastroenterology

## 2024-05-12 ENCOUNTER — Ambulatory Visit
Admission: RE | Admit: 2024-05-12 | Discharge: 2024-05-12 | Disposition: A | Discharge status: Home / Self Care | Attending: Gastroenterology | Admitting: Gastroenterology

## 2024-05-12 ENCOUNTER — Encounter: Payer: Self-pay | Admitting: Gastroenterology

## 2024-05-12 ENCOUNTER — Ambulatory Visit: Admitting: Certified Registered Nurse Anesthetist

## 2024-05-12 ENCOUNTER — Encounter
Admission: RE | Disposition: A | Discharge status: Home / Self Care | Payer: Self-pay | Source: Home / Self Care | Attending: Gastroenterology

## 2024-05-12 DIAGNOSIS — Z1211 Encounter for screening for malignant neoplasm of colon: Secondary | ICD-10-CM

## 2024-05-12 DIAGNOSIS — D122 Benign neoplasm of ascending colon: Secondary | ICD-10-CM | POA: Insufficient documentation

## 2024-05-12 DIAGNOSIS — D126 Benign neoplasm of colon, unspecified: Secondary | ICD-10-CM

## 2024-05-12 HISTORY — PX: COLONOSCOPY: SHX5424

## 2024-05-12 HISTORY — PX: POLYPECTOMY: SHX149

## 2024-05-12 SURGERY — COLONOSCOPY
Anesthesia: General

## 2024-05-12 MED ORDER — LIDOCAINE HCL (CARDIAC) PF 100 MG/5ML IV SOSY
PREFILLED_SYRINGE | INTRAVENOUS | Status: DC | PRN
  Administered 2024-05-12: 50 mg via INTRAVENOUS

## 2024-05-12 MED ORDER — PROPOFOL 500 MG/50ML IV EMUL
INTRAVENOUS | Status: DC | PRN
  Administered 2024-05-12: 150 ug/kg/min via INTRAVENOUS

## 2024-05-12 MED ORDER — SODIUM CHLORIDE 0.9 % IV SOLN: INTRAVENOUS | Status: DC

## 2024-05-12 MED ORDER — STERILE WATER FOR IRRIGATION IR SOLN
Status: DC | PRN
  Administered 2024-05-12: 120 mL

## 2024-05-12 MED ORDER — PROPOFOL 10 MG/ML IV BOLUS
INTRAVENOUS | Status: DC | PRN
  Administered 2024-05-12: 60 mg via INTRAVENOUS

## 2024-05-12 NOTE — Anesthesia Postprocedure Evaluation (Signed)
 Anesthesia Post Note  Patient: Ann Morrow  Procedure(s) Performed: COLONOSCOPY POLYPECTOMY, INTESTINE  Patient location during evaluation: Endoscopy Anesthesia Type: General Level of consciousness: awake and alert Pain management: pain level controlled Vital Signs Assessment: post-procedure vital signs reviewed and stable Respiratory status: spontaneous breathing, nonlabored ventilation, respiratory function stable and patient connected to nasal cannula oxygen Cardiovascular status: blood pressure returned to baseline and stable Postop Assessment: no apparent nausea or vomiting Anesthetic complications: no  No notable events documented.   Last Vitals:  Vitals:   05/12/24 0905 05/12/24 0915  BP: (!) 162/83 112/87  Pulse: 68   Resp: 18   Temp: (!) 36.1 C   SpO2: 100%     Last Pain:  Vitals:   05/12/24 0925  TempSrc:   PainSc: 0-No pain                 Enrique Harvest

## 2024-05-12 NOTE — Op Note (Signed)
 [facility] Gastroenterology Patient Name: Ann Morrow Procedure Date: 05/12/2024 8:28 AM MRN: [id number] Account #: [id number] Date of Birth: [birth year] Admit Type: Outpatient Age: 53 Room: [facility] ENDO ROOM 2 Gender: Female Note Status: Finalized Instrument Name: Charlyn Cooley [id number] Procedure:             Colonoscopy Indications:           Screening for colorectal malignant neoplasm Providers:             Luke Salaam MD, MD Referring MD:          Luke Salaam MD, MD (Referring MD) Medicines:             Monitored Anesthesia Care Complications:         No immediate complications. Procedure:             Pre-Anesthesia Assessment:                        - Prior to the procedure, a History and Physical was                         performed, and patient medications, allergies and                         sensitivities were reviewed. The patient's tolerance                         of previous anesthesia was reviewed.                        - The risks and benefits of the procedure and the                         sedation options and risks were discussed with the                         patient. All questions were answered and informed                         consent was obtained.                        - ASA Grade Assessment: II - A patient with mild                         systemic disease.                        After obtaining informed consent, the colonoscope was                         passed under direct vision. Throughout the procedure,                         the patient's blood pressure, pulse, and oxygen                         saturations were monitored continuously. The                         Colonoscope was  introduced through the anus and                         advanced to the the cecum, identified by the                         appendiceal orifice. The colonoscopy was performed                         with ease. The patient tolerated the procedure well.                          The quality of the bowel preparation was excellent.                         The ileocecal valve, appendiceal orifice, and rectum                         were photographed. Findings:      The perianal and digital rectal examinations were normal.      A 5 mm polyp was found in the ascending colon. The polyp was sessile.       The polyp was removed with a cold snare. Resection and retrieval were       complete.      The exam was otherwise without abnormality on direct and retroflexion       views. Impression:            - One 5 mm polyp in the ascending colon, removed with                         a cold snare. Resected and retrieved.                        - The examination was otherwise normal on direct and                         retroflexion views. Recommendation:        - Discharge patient to home (with escort).                        - Resume previous diet.                        - Continue present medications.                        - Await pathology results.                        - Repeat colonoscopy for surveillance based on                         pathology results. Procedure Code(s):     --- Professional ---                        [phone], Colonoscopy, flexible; with removal of                         tumor(s), polyp(s), or other lesion(s) by  snare                         technique Diagnosis Code(s):     --- Professional ---                        Z12.11, Encounter for screening for malignant neoplasm                         of colon                        D12.2, Benign neoplasm of ascending colon CPT copyright 2022 American Medical Association. All rights reserved. The codes documented in this report are preliminary and upon coder review may  be revised to meet current compliance requirements. Luke Salaam, MD Luke Salaam MD, MD 05/12/2024 9:04:04 AM This report has been signed electronically. Number of Addenda: 0 Note Initiated On: 05/12/2024 8:28  AM Scope Withdrawal Time: 0 hours 10 minutes 33 seconds  Total Procedure Duration: 0 hours 15 minutes 19 seconds  Estimated Blood Loss:  Estimated blood loss: none.      [facility]

## 2024-05-12 NOTE — Anesthesia Preprocedure Evaluation (Signed)
 Anesthesia Evaluation  Patient identified by MRN, date of birth, ID band Patient awake    Reviewed: Allergy & Precautions, NPO status , Patient's Chart, lab work & pertinent test results  Airway Mallampati: III  TM Distance: >3 FB Neck ROM: full    Dental  (+) Chipped   Pulmonary asthma    Pulmonary exam normal        Cardiovascular hypertension, Normal cardiovascular exam     Neuro/Psych negative neurological ROS  negative psych ROS   GI/Hepatic negative GI ROS, Neg liver ROS,,,  Endo/Other  negative endocrine ROS    Renal/GU negative Renal ROS  negative genitourinary   Musculoskeletal   Abdominal   Peds  Hematology negative hematology ROS (+)   Anesthesia Other Findings Past Medical History: No date: Allergy No date: Asthma No date: Elevated BP without diagnosis of hypertension No date: Fibroid No date: History of shingles No date: History of vertigo  Past Surgical History: No date: HERNIA REPAIR 10/31/2021: IR RADIOLOGIST EVAL & MGMT No date: TONSILLECTOMY AND ADENOIDECTOMY  BMI    Body Mass Index: 25.54 kg/m      Reproductive/Obstetrics negative OB ROS                             Anesthesia Physical Anesthesia Plan  ASA: 2  Anesthesia Plan: General   Post-op Pain Management: Minimal or no pain anticipated   Induction: Intravenous  PONV Risk Score and Plan: 3 and Propofol infusion, TIVA and Ondansetron  Airway Management Planned: Nasal Cannula  Additional Equipment: None  Intra-op Plan:   Post-operative Plan:   Informed Consent: I have reviewed the patients History and Physical, chart, labs and discussed the procedure including the risks, benefits and alternatives for the proposed anesthesia with the patient or authorized representative who has indicated his/her understanding and acceptance.     Dental advisory given  Plan Discussed with: CRNA and  Surgeon  Anesthesia Plan Comments: (Discussed risks of anesthesia with patient, including possibility of difficulty with spontaneous ventilation under anesthesia necessitating airway intervention, PONV, and rare risks such as cardiac or respiratory or neurological events, and allergic reactions. Discussed the role of CRNA in patient's perioperative care. Patient understands.)       Anesthesia Quick Evaluation

## 2024-05-12 NOTE — Transfer of Care (Signed)
 Immediate Anesthesia Transfer of Care Note  Patient: Ann Morrow  Procedure(s) Performed: COLONOSCOPY POLYPECTOMY, INTESTINE  Patient Location: Endoscopy Unit  Anesthesia Type:General  Level of Consciousness: drowsy  Airway & Oxygen Therapy: Patient Spontanous Breathing  Post-op Assessment: Report given to RN and Post -op Vital signs reviewed and stable  Post vital signs: Reviewed and stable  Last Vitals:  Vitals Value Taken Time  BP 162/83 05/12/24 0906  Temp 36.1 C 05/12/24 0905  Pulse 66 05/12/24 0907  Resp 14 05/12/24 0907  SpO2 100 % 05/12/24 0907  Vitals shown include unfiled device data.  Last Pain:  Vitals:   05/12/24 0905  TempSrc: Temporal  PainSc: Asleep         Complications: No notable events documented.

## 2024-05-12 NOTE — H&P (Signed)
 Luke Salaam, MD [address], [place], [place], [address] [address], [place], [place], [address] Phone: [phone]  Fax: [phone]  Primary Care Physician:  Eartha Gold, MD   Pre-Procedure History & Physical: HPI:  Ann Morrow is a 53 y.o. female is here for an colonoscopy.   Past Medical History:  Diagnosis Date   Allergy    Asthma    Elevated BP without diagnosis of hypertension    Fibroid    History of shingles    History of vertigo     Past Surgical History:  Procedure Laterality Date   HERNIA REPAIR     IR RADIOLOGIST EVAL & MGMT  10/31/2021   TONSILLECTOMY AND ADENOIDECTOMY      Prior to Admission medications   Medication Sig Start Date End Date Taking? Authorizing Provider  Ascorbic Acid (VITAMIN C PO) Take by mouth.   Yes [provider]  cetirizine (ZYRTEC) 10 MG tablet Take by mouth.   Yes [provider]  Multiple Vitamin (MULTI-VITAMIN) tablet Take 1 tablet by mouth daily.   Yes [provider]  Multiple Vitamins-Minerals (ZINC PO) Take by mouth.    [provider]  NORA-BE 0.35 MG tablet TAKE 1 TABLET DAILY 12/07/22   Teresa Fender, MD    Allergies as of 04/08/2024 - Review Complete 04/07/2024  Allergen Reaction Noted   Albumin human Other (See Comments) 05/30/2021   Allegra [fexofenadine]  12/11/2017   Latex  12/11/2017   Loratadine  02/10/2009    Family History  Problem Relation Age of Onset   Hypertension Mother    Glaucoma Mother    Diabetes Father    Breast cancer Maternal Grandmother 28   Renal cancer Maternal Grandfather    Liver cancer Paternal Grandmother    Cancer Paternal Grandfather    Healthy Brother    Healthy Brother    Breast cancer Cousin 51       paternal side    Social History   Socioeconomic History   Marital status: Married    Spouse name: Not on file   Number of children: Not on file   Years of education: Not on file   Highest  education level: Not on file  Occupational History   Occupation: Banking  Tobacco Use   Smoking status: Never   Smokeless tobacco: Current  Vaping Use   Vaping status: Never Used  Substance and Sexual Activity   Alcohol use: Yes    Alcohol/week: 1.0 standard drink of alcohol    Types: 1 Glasses of wine per week   Drug use: No   Sexual activity: Yes    Birth control/protection: Pill  Other Topics Concern   Not on file  Social History Narrative   Patient is Jehovah's Witness who refuses whole blood product transfusion.  In mutually monogamous relationship and married for 17 years (as of 12/11/17).   Social Drivers of Corporate investment banker Strain: Low Risk  (12/11/2017)   Overall Financial Resource Strain (CARDIA)    Difficulty of Paying Living Expenses: Not hard at all  Food Insecurity: No Food Insecurity (12/11/2017)   Hunger Vital Sign    Worried About Running Out of Food in the Last Year: Never true    Ran Out of Food in the Last Year: Never true  Transportation Needs: No Transportation Needs (12/11/2017)   PRAPARE - Administrator, Civil Service (Medical): No    Lack of Transportation (  Non-Medical): No  Physical Activity: Insufficiently Active (12/11/2017)   Exercise Vital Sign    Days of Exercise per Week: 2 days    Minutes of Exercise per Session: 50 min  Stress: No Stress Concern Present (12/11/2017)   [employer] of Occupational Health - Occupational Stress Questionnaire    Feeling of Stress : Not at all  Social Connections: Socially Integrated (12/11/2017)   Social Connection and Isolation Panel [NHANES]    Frequency of Communication with Friends and Family: More than three times a week    Frequency of Social Gatherings with Friends and Family: More than three times a week    Attends Religious Services: More than 4 times per year    Active Member of [employer] or Organizations: Yes    Attends Engineer, structural: More than 4 times per  year    Marital Status: Married  Catering manager Violence: Not At Risk (12/11/2017)   Humiliation, Afraid, Rape, and Kick questionnaire    Fear of Current or Ex-Partner: No    Emotionally Abused: No    Physically Abused: No    Sexually Abused: No    Review of Systems: See HPI, otherwise negative ROS  Physical Exam: BP 139/83   Pulse 67   Temp (!) 97.3 F (36.3 C) (Temporal)   Resp 18   Ht 5\' 3"  (1.6 m)   Wt 65.4 kg   LMP  (LMP Unknown) Comment: She has not had a cycle in 1.5 years. She had some spotting x 1 month ago for a few days.  SpO2 96%   BMI 25.54 kg/m  General:   Alert,  pleasant and cooperative in NAD Head:  Normocephalic and atraumatic. Neck:  Supple; no masses or thyromegaly. Lungs:  Clear throughout to auscultation, normal respiratory effort.    Heart:  +S1, +S2, Regular rate and rhythm, No edema. Abdomen:  Soft, nontender and nondistended. Normal bowel sounds, without guarding, and without rebound.   Neurologic:  Alert and  oriented x4;  grossly normal neurologically.  Impression/Plan: Ann Morrow is here for an colonoscopy to be performed for Screening colonoscopy average risk   Risks, benefits, limitations, and alternatives regarding  colonoscopy have been reviewed with the patient.  Questions have been answered.  All parties agreeable.   Luke Salaam, MD  05/12/2024, 8:40 AM

## 2024-05-13 LAB — SURGICAL PATHOLOGY

## 2024-05-14 ENCOUNTER — Ambulatory Visit: Payer: Self-pay | Admitting: Gastroenterology

## 2024-12-09 ENCOUNTER — Other Ambulatory Visit: Payer: Self-pay | Admitting: Nurse Practitioner

## 2024-12-09 DIAGNOSIS — Z1231 Encounter for screening mammogram for malignant neoplasm of breast: Secondary | ICD-10-CM

## 2025-01-06 ENCOUNTER — Other Ambulatory Visit: Payer: Self-pay | Admitting: Medical Genetics

## 2025-01-12 ENCOUNTER — Ambulatory Visit
Admission: RE | Admit: 2025-01-12 | Discharge: 2025-01-12 | Disposition: A | Discharge status: Home / Self Care | Payer: Self-pay | Source: Ambulatory Visit | Attending: Nurse Practitioner | Admitting: Nurse Practitioner

## 2025-01-12 ENCOUNTER — Ambulatory Visit
Admission: RE | Admit: 2025-01-12 | Discharge: 2025-01-12 | Disposition: A | Discharge status: Home / Self Care | Payer: Self-pay | Source: Ambulatory Visit | Attending: Medical Genetics | Admitting: Medical Genetics

## 2025-01-12 DIAGNOSIS — Z1231 Encounter for screening mammogram for malignant neoplasm of breast: Secondary | ICD-10-CM | POA: Insufficient documentation

## 2025-01-22 LAB — GENECONNECT MOLECULAR SCREEN: Genetic Analysis Overall Interpretation: NEGATIVE

## 2025-06-28 ENCOUNTER — Ambulatory Visit: Admitting: Family Medicine
